# Patient Record
Sex: Male | Born: 1955 | Race: White | Hispanic: No | Smoking: Current every day smoker
Health system: Southern US, Community
[De-identification: ages and names within clinical notes are randomized; demographics above are authoritative.]

## PROBLEM LIST (undated history)

## (undated) DIAGNOSIS — I10 Essential (primary) hypertension: Secondary | ICD-10-CM

## (undated) DIAGNOSIS — I219 Acute myocardial infarction, unspecified: Secondary | ICD-10-CM

---

## 2014-06-01 ENCOUNTER — Other Ambulatory Visit (HOSPITAL_COMMUNITY): Payer: Self-pay | Admitting: Family Medicine

## 2014-06-01 ENCOUNTER — Ambulatory Visit (HOSPITAL_COMMUNITY)
Admission: RE | Admit: 2014-06-01 | Discharge: 2014-06-01 | Disposition: A | Discharge status: Home / Self Care | Payer: Disability Insurance | Source: Ambulatory Visit | Attending: Family Medicine | Admitting: Family Medicine

## 2014-06-01 DIAGNOSIS — M11261 Other chondrocalcinosis, right knee: Secondary | ICD-10-CM | POA: Diagnosis not present

## 2014-06-01 DIAGNOSIS — M25561 Pain in right knee: Secondary | ICD-10-CM | POA: Insufficient documentation

## 2014-06-01 DIAGNOSIS — M545 Low back pain, unspecified: Secondary | ICD-10-CM

## 2014-06-01 DIAGNOSIS — I739 Peripheral vascular disease, unspecified: Secondary | ICD-10-CM | POA: Insufficient documentation

## 2014-06-01 DIAGNOSIS — M25562 Pain in left knee: Secondary | ICD-10-CM | POA: Diagnosis not present

## 2014-06-01 DIAGNOSIS — I70292 Other atherosclerosis of native arteries of extremities, left leg: Secondary | ICD-10-CM | POA: Diagnosis not present

## 2014-06-01 DIAGNOSIS — M79641 Pain in right hand: Secondary | ICD-10-CM | POA: Insufficient documentation

## 2014-06-01 DIAGNOSIS — M5136 Other intervertebral disc degeneration, lumbar region: Secondary | ICD-10-CM | POA: Insufficient documentation

## 2014-06-01 DIAGNOSIS — M1288 Other specific arthropathies, not elsewhere classified, other specified site: Secondary | ICD-10-CM | POA: Diagnosis not present

## 2015-05-18 ENCOUNTER — Emergency Department: Payer: Medicaid Other

## 2015-05-18 ENCOUNTER — Inpatient Hospital Stay
Admission: EM | Admit: 2015-05-18 | Discharge: 2015-05-21 | DRG: 065 | Disposition: A | Discharge status: Rehabilitation Facility | Payer: Medicaid Other | Attending: Internal Medicine | Admitting: Internal Medicine

## 2015-05-18 ENCOUNTER — Observation Stay: Payer: Medicaid Other

## 2015-05-18 ENCOUNTER — Observation Stay (HOSPITAL_BASED_OUTPATIENT_CLINIC_OR_DEPARTMENT_OTHER)
Admit: 2015-05-18 | Discharge: 2015-05-18 | Disposition: A | Discharge status: Home / Self Care | Payer: Medicaid Other | Attending: Internal Medicine | Admitting: Internal Medicine

## 2015-05-18 DIAGNOSIS — I6350 Cerebral infarction due to unspecified occlusion or stenosis of unspecified cerebral artery: Secondary | ICD-10-CM

## 2015-05-18 DIAGNOSIS — I639 Cerebral infarction, unspecified: Secondary | ICD-10-CM | POA: Diagnosis present

## 2015-05-18 DIAGNOSIS — I63532 Cerebral infarction due to unspecified occlusion or stenosis of left posterior cerebral artery: Principal | ICD-10-CM | POA: Diagnosis present

## 2015-05-18 DIAGNOSIS — I6359 Cerebral infarction due to unspecified occlusion or stenosis of other cerebral artery: Secondary | ICD-10-CM | POA: Diagnosis present

## 2015-05-18 DIAGNOSIS — H53461 Homonymous bilateral field defects, right side: Secondary | ICD-10-CM | POA: Diagnosis present

## 2015-05-18 DIAGNOSIS — I252 Old myocardial infarction: Secondary | ICD-10-CM

## 2015-05-18 DIAGNOSIS — G8191 Hemiplegia, unspecified affecting right dominant side: Secondary | ICD-10-CM | POA: Diagnosis present

## 2015-05-18 DIAGNOSIS — Z6841 Body Mass Index (BMI) 40.0 and over, adult: Secondary | ICD-10-CM

## 2015-05-18 DIAGNOSIS — Z139 Encounter for screening, unspecified: Secondary | ICD-10-CM

## 2015-05-18 DIAGNOSIS — M810 Age-related osteoporosis without current pathological fracture: Secondary | ICD-10-CM | POA: Diagnosis present

## 2015-05-18 DIAGNOSIS — F17210 Nicotine dependence, cigarettes, uncomplicated: Secondary | ICD-10-CM | POA: Diagnosis present

## 2015-05-18 DIAGNOSIS — M1990 Unspecified osteoarthritis, unspecified site: Secondary | ICD-10-CM | POA: Diagnosis present

## 2015-05-18 DIAGNOSIS — R29810 Facial weakness: Secondary | ICD-10-CM | POA: Diagnosis present

## 2015-05-18 DIAGNOSIS — I10 Essential (primary) hypertension: Secondary | ICD-10-CM | POA: Diagnosis present

## 2015-05-18 DIAGNOSIS — Z8249 Family history of ischemic heart disease and other diseases of the circulatory system: Secondary | ICD-10-CM

## 2015-05-18 DIAGNOSIS — E785 Hyperlipidemia, unspecified: Secondary | ICD-10-CM | POA: Diagnosis present

## 2015-05-18 HISTORY — DX: Essential (primary) hypertension: I10

## 2015-05-18 HISTORY — DX: Acute myocardial infarction, unspecified: I21.9

## 2015-05-18 LAB — COMPREHENSIVE METABOLIC PANEL
ALK PHOS: 27 U/L — AB (ref 38–126)
ALT: 61 U/L (ref 17–63)
AST: 52 U/L — AB (ref 15–41)
Albumin: 4.3 g/dL (ref 3.5–5.0)
Anion gap: 8 (ref 5–15)
BUN: 14 mg/dL (ref 6–20)
CALCIUM: 9.4 mg/dL (ref 8.9–10.3)
CHLORIDE: 102 mmol/L (ref 101–111)
CO2: 25 mmol/L (ref 22–32)
CREATININE: 1.16 mg/dL (ref 0.61–1.24)
Glucose, Bld: 102 mg/dL — ABNORMAL HIGH (ref 65–99)
Potassium: 4.3 mmol/L (ref 3.5–5.1)
Sodium: 135 mmol/L (ref 135–145)
Total Bilirubin: 0.9 mg/dL (ref 0.3–1.2)
Total Protein: 7.7 g/dL (ref 6.5–8.1)

## 2015-05-18 LAB — CBC WITH DIFFERENTIAL/PLATELET
Basophils Absolute: 0.1 10*3/uL (ref 0–0.1)
Basophils Relative: 1 %
Eosinophils Absolute: 0 10*3/uL (ref 0–0.7)
Eosinophils Relative: 0 %
HEMATOCRIT: 47.4 % (ref 40.0–52.0)
Hemoglobin: 15.8 g/dL (ref 13.0–18.0)
LYMPHS ABS: 1.2 10*3/uL (ref 1.0–3.6)
LYMPHS PCT: 12 %
MCH: 31.6 pg (ref 26.0–34.0)
MCHC: 33.4 g/dL (ref 32.0–36.0)
MCV: 94.8 fL (ref 80.0–100.0)
MONO ABS: 0.8 10*3/uL (ref 0.2–1.0)
Monocytes Relative: 8 %
NEUTROS ABS: 8.2 10*3/uL — AB (ref 1.4–6.5)
Neutrophils Relative %: 79 %
Platelets: 257 10*3/uL (ref 150–440)
RBC: 5 MIL/uL (ref 4.40–5.90)
RDW: 13 % (ref 11.5–14.5)
WBC: 10.3 10*3/uL (ref 3.8–10.6)

## 2015-05-18 LAB — URINE DRUG SCREEN, QUALITATIVE (ARMC ONLY)
AMPHETAMINES, UR SCREEN: NOT DETECTED
Barbiturates, Ur Screen: NOT DETECTED
Benzodiazepine, Ur Scrn: NOT DETECTED
Cannabinoid 50 Ng, Ur ~~LOC~~: NOT DETECTED
Cocaine Metabolite,Ur ~~LOC~~: NOT DETECTED
MDMA (ECSTASY) UR SCREEN: NOT DETECTED
Methadone Scn, Ur: NOT DETECTED
Opiate, Ur Screen: NOT DETECTED
Phencyclidine (PCP) Ur S: NOT DETECTED
Tricyclic, Ur Screen: NOT DETECTED

## 2015-05-18 LAB — TROPONIN I: Troponin I: <0.03 ng/mL (ref <0.031)

## 2015-05-18 LAB — PROTIME-INR
INR: 1.07
Prothrombin Time: 14.1 seconds (ref 11.4–15.0)

## 2015-05-18 LAB — ETHANOL: ALCOHOL ETHYL (B): 5 mg/dL — AB (ref <5)

## 2015-05-18 MED ORDER — NICOTINE 21 MG/24HR TD PT24
21.0000 mg | MEDICATED_PATCH | Freq: Every day | TRANSDERMAL | Status: DC
  Administered 2015-05-19 – 2015-05-21 (×3): 21 mg via TRANSDERMAL
  Filled 2015-05-18 (×3): qty 1

## 2015-05-18 MED ORDER — ACETAMINOPHEN 325 MG PO TABS
650.0000 mg | ORAL_TABLET | Freq: Once | ORAL | Status: AC
  Administered 2015-05-18: 650 mg via ORAL

## 2015-05-18 MED ORDER — ENOXAPARIN SODIUM 40 MG/0.4ML ~~LOC~~ SOLN
40.0000 mg | SUBCUTANEOUS | Status: DC
  Administered 2015-05-19 (×2): 40 mg via SUBCUTANEOUS
  Filled 2015-05-18 (×2): qty 0.4

## 2015-05-18 MED ORDER — PNEUMOCOCCAL VAC POLYVALENT 25 MCG/0.5ML IJ INJ
0.5000 mL | INJECTION | INTRAMUSCULAR | Status: DC
  Filled 2015-05-18: qty 0.5

## 2015-05-18 MED ORDER — STROKE: EARLY STAGES OF RECOVERY BOOK
Freq: Once | Status: AC
  Administered 2015-05-18: 18:00:00

## 2015-05-18 MED ORDER — ASPIRIN EC 325 MG PO TBEC
325.0000 mg | DELAYED_RELEASE_TABLET | Freq: Every day | ORAL | Status: DC
  Administered 2015-05-18 – 2015-05-21 (×4): 325 mg via ORAL
  Filled 2015-05-18 (×5): qty 1

## 2015-05-18 MED ORDER — ACETAMINOPHEN 325 MG PO TABS
ORAL_TABLET | ORAL | Status: AC
  Filled 2015-05-18: qty 2
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
## 2015-05-18 NOTE — Progress Notes (Signed)
*  PRELIMINARY RESULTS* Echocardiogram 2D Echocardiogram has been performed.  Bryan Schultz 05/18/2015, 7:17 PM
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
## 2015-05-18 NOTE — ED Notes (Signed)
Patient continues to notice numbness R arm. Grip is equal to L however coordination of R arm and hand less. Continues headache. Speech clear, patient oriented.

## 2015-05-18 NOTE — ED Notes (Signed)
R arm numbness, grips equal but weak to patient. States he does not know when this started or if he had it this am.

## 2015-05-19 ENCOUNTER — Inpatient Hospital Stay: Payer: Medicaid Other

## 2015-05-19 ENCOUNTER — Encounter: Payer: Self-pay | Admitting: Radiology

## 2015-05-19 DIAGNOSIS — G8191 Hemiplegia, unspecified affecting right dominant side: Secondary | ICD-10-CM | POA: Diagnosis present

## 2015-05-19 DIAGNOSIS — I63532 Cerebral infarction due to unspecified occlusion or stenosis of left posterior cerebral artery: Secondary | ICD-10-CM | POA: Diagnosis not present

## 2015-05-19 DIAGNOSIS — H53461 Homonymous bilateral field defects, right side: Secondary | ICD-10-CM | POA: Diagnosis present

## 2015-05-19 DIAGNOSIS — I252 Old myocardial infarction: Secondary | ICD-10-CM | POA: Diagnosis not present

## 2015-05-19 DIAGNOSIS — Z6841 Body Mass Index (BMI) 40.0 and over, adult: Secondary | ICD-10-CM | POA: Diagnosis not present

## 2015-05-19 DIAGNOSIS — I639 Cerebral infarction, unspecified: Secondary | ICD-10-CM | POA: Diagnosis present

## 2015-05-19 DIAGNOSIS — I10 Essential (primary) hypertension: Secondary | ICD-10-CM | POA: Diagnosis present

## 2015-05-19 DIAGNOSIS — Z8249 Family history of ischemic heart disease and other diseases of the circulatory system: Secondary | ICD-10-CM | POA: Diagnosis not present

## 2015-05-19 DIAGNOSIS — I6359 Cerebral infarction due to unspecified occlusion or stenosis of other cerebral artery: Secondary | ICD-10-CM | POA: Diagnosis present

## 2015-05-19 DIAGNOSIS — F17210 Nicotine dependence, cigarettes, uncomplicated: Secondary | ICD-10-CM | POA: Diagnosis present

## 2015-05-19 DIAGNOSIS — R29810 Facial weakness: Secondary | ICD-10-CM | POA: Diagnosis present

## 2015-05-19 DIAGNOSIS — E785 Hyperlipidemia, unspecified: Secondary | ICD-10-CM | POA: Diagnosis present

## 2015-05-19 DIAGNOSIS — M810 Age-related osteoporosis without current pathological fracture: Secondary | ICD-10-CM | POA: Diagnosis present

## 2015-05-19 DIAGNOSIS — M1990 Unspecified osteoarthritis, unspecified site: Secondary | ICD-10-CM | POA: Diagnosis present

## 2015-05-19 LAB — URINALYSIS COMPLETE WITH MICROSCOPIC (ARMC ONLY)
BACTERIA UA: NONE SEEN
Bilirubin Urine: NEGATIVE
Glucose, UA: NEGATIVE mg/dL
HGB URINE DIPSTICK: NEGATIVE
KETONES UR: NEGATIVE mg/dL
LEUKOCYTES UA: NEGATIVE
NITRITE: NEGATIVE
PH: 6 (ref 5.0–8.0)
PROTEIN: NEGATIVE mg/dL
SPECIFIC GRAVITY, URINE: 1.017 (ref 1.005–1.030)

## 2015-05-19 LAB — LIPID PANEL
Cholesterol: 148 mg/dL (ref 0–200)
HDL: 33 mg/dL — AB (ref >40)
LDL CALC: 93 mg/dL (ref 0–99)
TRIGLYCERIDES: 111 mg/dL (ref <150)
Total CHOL/HDL Ratio: 4.5 RATIO
VLDL: 22 mg/dL (ref 0–40)

## 2015-05-19 MED ORDER — IOHEXOL 350 MG/ML SOLN: 80.0000 mL | Freq: Once | INTRAVENOUS | Status: DC | PRN

## 2015-05-19 MED ORDER — HYDRALAZINE HCL 20 MG/ML IJ SOLN
10.0000 mg | Freq: Four times a day (QID) | INTRAMUSCULAR | Status: DC | PRN
Start: 2015-05-19 — End: 2015-05-21

## 2015-05-19 MED ORDER — ATORVASTATIN CALCIUM 20 MG PO TABS
40.0000 mg | ORAL_TABLET | Freq: Every day | ORAL | Status: DC
  Administered 2015-05-19 – 2015-05-20 (×2): 40 mg via ORAL
  Filled 2015-05-19 (×2): qty 2

## 2015-05-19 NOTE — Progress Notes (Addendum)
   05/19/15 1915  What Happened  Was fall witnessed? No  Was patient injured? No  Patient found on floor  Found by Staff-comment  Stated prior activity bathroom-unassisted  Follow Up  MD notified dr. Sheryle Haildiamond  Time MD notified [phone]  Family notified No- patient refusal (unable to get on the phone at this time)  Time family notified 1923  Additional tests No  Vitals  Temp 98.1 F (36.7 C)  Temp Source Oral  BP (!) 150/90 mmHg  MAP (mmHg) 106  BP Method Automatic  Pulse Rate 97  Pulse Rate Source Monitor  [facility] notified
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
## 2015-05-19 NOTE — Progress Notes (Signed)
Patient found on the floor beside of bed. Patient stated that he was not fully awake and tried to get up to use the bathroom. Found by Lars MassonKalani, NT. Bo McclintockBrewer,Elasia Furnish S, RN
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
## 2015-05-19 NOTE — Progress Notes (Signed)
Speech Therapy Note: received order, reviewed chart notes and consulted NSG re: pt's status this AM. NSG reported no overt deficits; pt was swallowing pills w/ water and communicating wants/needs appropriately. Upon meeting pt in room, pt stated same. He denied any s/s of dysphagia and was verbally communicating on his cell phone w/ family appropriately. Pt is missing most dentition which can impact articulation of speech.  As pt denies any ST needs at this time, ST services will sign off but be available for any further if pt's status changes. NSG agreed. Pt agreed.
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
## 2015-05-19 NOTE — Plan of Care (Signed)
Problem: Activity: Goal: Risk for activity intolerance will decrease Outcome: Progressing Pt has been resting comfortably during shift. Pt has some weakness to right side but hand grips are strong. No other sign of distress noted. Will continue to monitor.

## 2015-05-20 ENCOUNTER — Inpatient Hospital Stay: Payer: Medicaid Other

## 2015-05-20 MED ORDER — AMLODIPINE BESYLATE 5 MG PO TABS
5.0000 mg | ORAL_TABLET | Freq: Every day | ORAL | Status: DC
  Administered 2015-05-20 – 2015-05-21 (×2): 5 mg via ORAL
  Filled 2015-05-20 (×2): qty 1

## 2015-05-20 MED ORDER — ENOXAPARIN SODIUM 40 MG/0.4ML ~~LOC~~ SOLN
40.0000 mg | Freq: Two times a day (BID) | SUBCUTANEOUS | Status: DC
  Administered 2015-05-20 – 2015-05-21 (×2): 40 mg via SUBCUTANEOUS
  Filled 2015-05-20 (×2): qty 0.4

## 2015-05-20 MED ORDER — ACETAMINOPHEN 325 MG PO TABS
650.0000 mg | ORAL_TABLET | Freq: Four times a day (QID) | ORAL | Status: DC | PRN
  Administered 2015-05-20 – 2015-05-21 (×2): 650 mg via ORAL
  Filled 2015-05-20 (×2): qty 2

## 2015-05-20 NOTE — Plan of Care (Signed)
Problem: Education: Goal: Knowledge of disease or condition will improve Outcome: Progressing Pt educated on stroke plan, stroke handout given to the pt, pt educated on falls prevention, verbalized understanding, pt given pt information guide  Problem: Health Behavior/Discharge Planning: Goal: Ability to manage health-related needs will improve Outcome: Progressing No plans for discharge at this time  Problem: Self-Care: Goal: Ability to participate in self-care as condition permits will improve Outcome: Progressing Pt calling for assistance when needed  Problem: Tissue Perfusion: Goal: Cerebral tissue perfusion will improve Outcome: Progressing VSS, afebrile, q2hr vitals and neuro checks continue  Problem: Safety: Goal: Ability to remain free from injury will improve Outcome: Progressing Pt is high falls, bed and chair alarms in use, call bell and phone within reach, hourly safety rounds made, pt remains free of injury this shift  Problem: Activity: Goal: Risk for activity intolerance will decrease Outcome: Progressing Pt needs assistance with transfers
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
## 2015-05-20 NOTE — Progress Notes (Signed)
59 y/o M on Lovenox 40 mg daily for DVT prophylaxis.   BMI= 49, CrCl 115 ml/min  Due to BMI > 40, will change Lovenox to 40 mg bid.

## 2015-05-20 NOTE — Plan of Care (Signed)
Problem: Tissue Perfusion: Goal: Cerebral tissue perfusion will improve Outcome: Progressing Pt had no injury from fall on assessment. Pt was difficult to get back in bed and and require the lift and many other staff. A later assessment revealed a bruise on left upper arm. Pt has been resting comfortable the shift. Pt had an incontinent episode x1 during shift and pt had a partial bath and linen change. No other signs of distress noted. Will continue to monitor.

## 2015-05-21 ENCOUNTER — Inpatient Hospital Stay (HOSPITAL_COMMUNITY)
Admission: RE | Admit: 2015-05-21 | Discharge: 2015-06-05 | DRG: 057 | Disposition: A | Discharge status: Home Health | Payer: Medicaid Other | Source: Intra-hospital | Attending: Physical Medicine & Rehabilitation | Admitting: Physical Medicine & Rehabilitation

## 2015-05-21 DIAGNOSIS — H53461 Homonymous bilateral field defects, right side: Secondary | ICD-10-CM | POA: Diagnosis not present

## 2015-05-21 DIAGNOSIS — I639 Cerebral infarction, unspecified: Secondary | ICD-10-CM | POA: Diagnosis not present

## 2015-05-21 DIAGNOSIS — Z8249 Family history of ischemic heart disease and other diseases of the circulatory system: Secondary | ICD-10-CM | POA: Diagnosis not present

## 2015-05-21 DIAGNOSIS — E785 Hyperlipidemia, unspecified: Secondary | ICD-10-CM | POA: Diagnosis not present

## 2015-05-21 DIAGNOSIS — I69993 Ataxia following unspecified cerebrovascular disease: Secondary | ICD-10-CM | POA: Diagnosis not present

## 2015-05-21 DIAGNOSIS — F17210 Nicotine dependence, cigarettes, uncomplicated: Secondary | ICD-10-CM

## 2015-05-21 DIAGNOSIS — I63532 Cerebral infarction due to unspecified occlusion or stenosis of left posterior cerebral artery: Secondary | ICD-10-CM | POA: Diagnosis not present

## 2015-05-21 DIAGNOSIS — Z720 Tobacco use: Secondary | ICD-10-CM | POA: Insufficient documentation

## 2015-05-21 DIAGNOSIS — J329 Chronic sinusitis, unspecified: Secondary | ICD-10-CM

## 2015-05-21 DIAGNOSIS — I10 Essential (primary) hypertension: Secondary | ICD-10-CM | POA: Insufficient documentation

## 2015-05-21 DIAGNOSIS — R600 Localized edema: Secondary | ICD-10-CM

## 2015-05-21 DIAGNOSIS — R278 Other lack of coordination: Secondary | ICD-10-CM | POA: Insufficient documentation

## 2015-05-21 DIAGNOSIS — I69398 Other sequelae of cerebral infarction: Secondary | ICD-10-CM

## 2015-05-21 DIAGNOSIS — Z6841 Body Mass Index (BMI) 40.0 and over, adult: Secondary | ICD-10-CM

## 2015-05-21 DIAGNOSIS — I69351 Hemiplegia and hemiparesis following cerebral infarction affecting right dominant side: Secondary | ICD-10-CM | POA: Diagnosis present

## 2015-05-21 DIAGNOSIS — I69319 Unspecified symptoms and signs involving cognitive functions following cerebral infarction: Secondary | ICD-10-CM

## 2015-05-21 DIAGNOSIS — J0110 Acute frontal sinusitis, unspecified: Secondary | ICD-10-CM | POA: Diagnosis not present

## 2015-05-21 LAB — CBC
HCT: 45.3 % (ref 39.0–52.0)
Hemoglobin: 15.7 g/dL (ref 13.0–17.0)
MCH: 32.4 pg (ref 26.0–34.0)
MCHC: 34.7 g/dL (ref 30.0–36.0)
MCV: 93.4 fL (ref 78.0–100.0)
PLATELETS: 232 10*3/uL (ref 150–400)
RBC: 4.85 MIL/uL (ref 4.22–5.81)
RDW: 12.7 % (ref 11.5–15.5)
WBC: 6.7 10*3/uL (ref 4.0–10.5)

## 2015-05-21 LAB — BASIC METABOLIC PANEL
ANION GAP: 8 (ref 5–15)
BUN: 15 mg/dL (ref 6–20)
CO2: 24 mmol/L (ref 22–32)
Calcium: 9.4 mg/dL (ref 8.9–10.3)
Chloride: 107 mmol/L (ref 101–111)
Creatinine, Ser: 1.07 mg/dL (ref 0.61–1.24)
GLUCOSE: 92 mg/dL (ref 65–99)
POTASSIUM: 4.2 mmol/L (ref 3.5–5.1)
Sodium: 139 mmol/L (ref 135–145)

## 2015-05-21 LAB — CREATININE, SERUM
CREATININE: 1.25 mg/dL — AB (ref 0.61–1.24)
GFR calc Af Amer: >60 mL/min (ref >60)
GFR calc non Af Amer: >60 mL/min (ref >60)

## 2015-05-21 MED ORDER — ASPIRIN EC 325 MG PO TBEC
325.0000 mg | DELAYED_RELEASE_TABLET | Freq: Every day | ORAL | Status: DC
  Administered 2015-05-22 – 2015-06-05 (×15): 325 mg via ORAL
  Filled 2015-05-21 (×15): qty 1

## 2015-05-21 MED ORDER — ONDANSETRON HCL 4 MG PO TABS: 4.0000 mg | ORAL_TABLET | Freq: Four times a day (QID) | ORAL | Status: DC | PRN

## 2015-05-21 MED ORDER — AMLODIPINE BESYLATE 5 MG PO TABS: 5.0000 mg | ORAL_TABLET | Freq: Every day | ORAL | Status: DC

## 2015-05-21 MED ORDER — ENOXAPARIN SODIUM 40 MG/0.4ML ~~LOC~~ SOLN: 40.0000 mg | Freq: Two times a day (BID) | SUBCUTANEOUS | Status: DC

## 2015-05-21 MED ORDER — ATORVASTATIN CALCIUM 40 MG PO TABS: 40.0000 mg | ORAL_TABLET | Freq: Every day | ORAL | Status: DC

## 2015-05-21 MED ORDER — ONDANSETRON HCL 4 MG/2ML IJ SOLN: 4.0000 mg | Freq: Four times a day (QID) | INTRAMUSCULAR | Status: DC | PRN

## 2015-05-21 MED ORDER — ACETAMINOPHEN 325 MG PO TABS: 650.0000 mg | ORAL_TABLET | Freq: Four times a day (QID) | ORAL | Status: AC | PRN

## 2015-05-21 MED ORDER — ASPIRIN 325 MG PO TBEC: 325.0000 mg | DELAYED_RELEASE_TABLET | Freq: Every day | ORAL | Status: DC

## 2015-05-21 MED ORDER — AMLODIPINE BESYLATE 5 MG PO TABS
5.0000 mg | ORAL_TABLET | Freq: Every day | ORAL | Status: DC
  Administered 2015-05-22 – 2015-06-05 (×15): 5 mg via ORAL
  Filled 2015-05-21 (×15): qty 1

## 2015-05-21 MED ORDER — NICOTINE 21 MG/24HR TD PT24
21.0000 mg | MEDICATED_PATCH | Freq: Every day | TRANSDERMAL | Status: DC
  Administered 2015-05-22 – 2015-06-05 (×15): 21 mg via TRANSDERMAL
  Filled 2015-05-21 (×15): qty 1

## 2015-05-21 MED ORDER — NICOTINE 21 MG/24HR TD PT24: 21.0000 mg | MEDICATED_PATCH | Freq: Every day | TRANSDERMAL | Status: DC

## 2015-05-21 MED ORDER — SORBITOL 70 % SOLN: 30.0000 mL | Freq: Every day | Status: DC | PRN

## 2015-05-21 MED ORDER — ENOXAPARIN SODIUM 40 MG/0.4ML ~~LOC~~ SOLN
40.0000 mg | Freq: Two times a day (BID) | SUBCUTANEOUS | Status: DC
  Administered 2015-05-21 – 2015-06-05 (×30): 40 mg via SUBCUTANEOUS
  Filled 2015-05-21 (×30): qty 0.4

## 2015-05-21 MED ORDER — ACETAMINOPHEN 325 MG PO TABS
650.0000 mg | ORAL_TABLET | Freq: Four times a day (QID) | ORAL | Status: DC | PRN
  Administered 2015-05-22 – 2015-06-05 (×26): 650 mg via ORAL
  Filled 2015-05-21 (×29): qty 2

## 2015-05-21 MED ORDER — ATORVASTATIN CALCIUM 40 MG PO TABS
40.0000 mg | ORAL_TABLET | Freq: Every day | ORAL | Status: DC
  Administered 2015-05-21 – 2015-06-04 (×15): 40 mg via ORAL
  Filled 2015-05-21 (×14): qty 1

## 2015-05-21 NOTE — Interval H&P Note (Signed)
Bryan Schultz was admitted today to Inpatient Rehabilitation with the diagnosis of left PCA infarct.  The patient's history has been reviewed, patient examined, and there is no change in status.  Patient continues to be appropriate for intensive inpatient rehabilitation.  I have reviewed the patient's chart and labs.  Questions were answered to the patient's satisfaction. The PAPE has been reviewed and assessment remains appropriate.  Bryan Schultz Bryan Schultz 05/21/2015, 10:31 PM

## 2015-05-21 NOTE — Care Management (Signed)
Admitted to [facility] with the diagnosis of acute CVA. Lives alone. Friend of 18 years is April Deldgo [phone]). Last seen Dr. Moishe Spiceuhl a week ago [facility]. No home health. No skilled facility. No home oxygen. Uses no aids for ambulation prior to this admission. Takes care of all basic and instrumental activities of daily living himself prior to this admission.  Physical therapy evaluation completed. Recommending acute inpatient rehabilitation. Spoke with [name] and friend at the bedside. Both are in agreement for transferring to Acute Inpatient [facility] at [facility].  Lelon FrohlichEugenia Logue, RN representative for Inpatient Acute Rehabilitation at [facility] updated. Gwenette GreetBrenda S Brandilyn Nanninga RN MSN CCM Care Management [phone]

## 2015-05-21 NOTE — PMR Pre-admission (Addendum)
[facility] PMR Admission Coordinator Pre-Admission Assessment  Patient: Bryan Schultz is an 59 y.o., male MRN: [id number] DOB: Aug 01, 1955 Height:  (185.4 cm) Weight: (!) 170.694 kg (376 lb 5 oz)  Insurance Information HMO:      PPO:       PCP:       IPA:       80/20:       OTHER:   PRIMARY: Medicaid [place] access      Policy#: [id number]      Subscriber: Wille Celeste CM Name:        Phone#:       Fax#:   Pre-Cert#:        Employer: Not employed/Disabled Benefits:  Phone #: [phone]     Name: Automated Eff. Date: Eligible 05/21/15 with coverage code [id number]     Deduct:        Out of Pocket Max:        Life Max:   CIR:        SNF:   Outpatient:       Co-Pay:   Home Health:        Co-Pay:   DME:       Co-Pay:   Providers:     Emergency Contact Information Contact Information    Name Relation Home Work Mobile   Delgoto,April Significant other   [phone]   Kaiel, [place]   [phone]      Current Medical History  Patient Admitting Diagnosis:  L PCA infarct  History of Present Illness: A 59 year old right-handed male with history essential hypertension, tobacco abuse, morbid obesity 376 pounds. On no prescription medications prior to admission. Patient lives alone independently prior to admission using a walking stick with report of recent falls. Plans to stay with his girlfriend. One level apartment second floor with elevator. Presented 05/18/2015 with altered mental status and right-sided weakness. CT showed hypoattenuation in left occipital lobe consistent with PCA territory infarct. CT cervical spine unremarkable except some spurring at C5-6-6-7. Patient did not receive TPA. Echocardiogram with ejection fraction of 60% grade 1 diastolic dysfunction. No cardiac source of emboli. CT angiogram of head and neck with distal small vessel attenuation corresponding the distal microvascular changes. 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 Neurology consulted  placed on aspirin for CVA prophylaxis. Subcutaneous Lovenox for DVT prophylaxis. Tolerating a regular diet. Follow-up CT of the head 05/20/2015 shows evolving left temporal, parietal and occipital lobe infarcts no evidence of hemorrhagic transformation. Therapy evaluations completed requiring min mod assist for bed mobility and transfers. Patient to be admitted for comprehensive inpatient rehabilitation program.   Patient's medical record from [facility] has been reviewed by the rehabilitation admission coordinator and physician.  NIH Stroke scale: 4  Past Medical History  Past Medical History  Diagnosis Date  . Hypertension   . Myocardial infarction [facility])     Family History   family history includes Hypertension in his brother, father, and mother.  Prior Rehab/Hospitalizations Has the patient had major surgery during 100 days prior to admission? No    Current Medications See MAR from [facility]  Patients Current Diet:   Heart diet with thin liquids  Precautions / Restrictions Precautions Precautions: Fall Restrictions Weight Bearing Restrictions: No   Has the patient had 2 or more falls or a fall with injury in the past year?Yes.  Reports at least 6 falls in the past 6 months with minor injuries.  Prior Activity Level Community (5-7x/wk): Went out  daily.  Is very social.  Prior Functional Level Self Care: Did the patient need help bathing, dressing, using the toilet or eating?  Independent  Indoor Mobility: Did the patient need assistance with walking from room to room (with or without device)? Independent  Stairs: Did the patient need assistance with internal or external stairs (with or without device)? Independent  Functional Cognition: Did the patient need help planning regular tasks such as shopping or remembering to take medications? Independent  Home Assistive Devices / Equipment Home Assistive Devices/Equipment: Cane (specify quad or straight)  (single) Home Equipment: Walker - 2 wheels, Cane - single point  Prior Device Use: Indicate devices/aids used by the patient prior to current illness, exacerbation or injury? Walker   Prior Functional Level Current Functional Level  Bed Mobility  Independent  Mod assist   Transfers  Independent  Mod assist   Mobility - Walk/Wheelchair  Independent  Other (Unable)   Upper Body Dressing  Independent  Mod assist   Lower Body Dressing  Independent  Max assist   Grooming  Independent  Min assist   Eating/Drinking  Independent  Min assist   Toilet Transfer  Independent  Total assist   Bladder Continence   Incontinence at times  Some incontinence   Bowel Management  Incontinence at times  Some incontinence.  Last BM 05/20/15   Stair Climbing  Independent  Other (Unable)   Communication  WDL  WDL   Memory  WDL  Intact   Cooking/Meal Prep  Independent      Housework  Independent    Money Management  Independent    Driving  Yes, independent     Special needs/care consideration BiPAP/CPAP No, but likely has sleep apnea per his girlfriend CPM No Continuous Drip IV No Dialysis No         Life Vest No Oxygen No Special Bed No Trach Size No Wound Vac (area) No      Skin: Has abrasions from accident                              Bowel mgmt: Incontinent at times.  Last BM 05/20/15 Bladder mgmt: Voiding in urinal with incontinence at times Diabetic mgmt: Prediabetic per girlfriend  Previous Home Environment Living Arrangements: Alone Available Help at Discharge: Friend(s), Available PRN/intermittently Type of Home: House Home Layout: One level Home Access: Stairs to enter Entrance Stairs-Rails: None Entrance Stairs-Number of Steps: 5 Bathroom Shower/Tub: Hydrographic surveyor, Sport and exercise psychologist: Pharmacist, community: Yes Home Care Services: No Additional Comments: Patient reports he was on disability but would "piddle around" doing  odd jobs to help make money.    Discharge Living Setting Plans for Discharge Living Setting: Lives with (comment), Apartment (Plans home with Girlfriend after rehab.) Type of Home at Discharge: Apartment (2nd floor apartment.  Building has an Engineer, structural.) Discharge Home Layout: One level Discharge Home Access: Other (comment) (Small step up from curb.) Does the patient have any problems obtaining your medications?: No  Social/Family/Support Systems Patient Roles: Other (Comment) (Has a girlfriend.) Contact Information: April Delgoto - girlfriend Anticipated Caregiver: April Anticipated Caregiver's Contact Information: April - GF [phone] Ability/Limitations of Caregiver: April does not work.  She is disabled but can do supervision and very light assistance for patient. Caregiver Availability: 24/7 Discharge Plan Discussed with Primary Caregiver: Yes Is Caregiver In Agreement with Plan?: Yes Does Caregiver/Family have Issues with Lodging/Transportation while Pt is in Rehab?: No  Goals/Additional Needs Patient/Family Goal for Rehab: PT/OT supervision to min assist goals Expected length of stay: 14-18 days Cultural Considerations: None Dietary Needs: Heart diet, thin liquids Equipment Needs: TBD Pt/Family Agrees to Admission and willing to participate: Yes Program Orientation Provided & Reviewed with Pt/Caregiver Including Roles  & Responsibilities: Yes  Patient Condition: I have reviewed all notes from [facility] and I have shared all information with rehab MD.  Patient suffered a L PCA infarct.  He is currently receiving PT and OT and would benefit from a comprehensive inpatient program to coordinate his rehab recovery.  Patient is agreeable and is tolerating PT and OT therapies.  ST has signed off on acute at this time.  Patient was independent PTA and he can tolerate 3 hours of therapy a day.  Rehab MD has approved inpatient rehab admission for today.  Preadmission Screen  Completed By:  Trish MageLogue, Eugenia M, 05/21/2015 12:14 PM ______________________________________________________________________   Discussed status with Dr. Allena KatzPatel on 05/21/15 at 1214 and received telephone approval for admission today.  Admission Coordinator:  Trish MageLogue, Eugenia M, time 1215/Date 05/21/15   Assessment/Plan: Diagnosis: L PCA infarct  1. Does the need for close, 24 hr/day  Medical supervision in concert with the patient's rehab needs make it unreasonable for this patient to be served in a less intensive setting? Yes  2. Co-Morbidities requiring supervision/potential complications: HTN (monitor and provide prns in accordance with increased physical exertion and pain), tobacco abuse (cont to counsel), morbid obesity (Body mass index is 49.66 kg/(m^2)., diet and exercise education, cont to encourage weight loss to increase endurance and promote overall health) 3. Due to safety, disease management, medication administration and patient education, does the patient require 24 hr/day rehab nursing? Yes 4. Does the patient require coordinated care of a physician, rehab nurse, PT (1-2 hrs/day, 5 days/week) and OT (1-2 hrs/day, 5 days/week) to address physical and functional deficits in the context of the above medical diagnosis(es)? Yes Addressing deficits in the following areas: balance, endurance, locomotion, strength, transferring, bathing, dressing, toileting and psychosocial support 5. Can the patient actively participate in an intensive therapy program of at least 3 hrs of therapy 5 days a week? Yes 6. The potential for patient to make measurable gains while on inpatient rehab is excellent 7. Anticipated functional outcomes upon discharge from inpatients are: supervision and min assist PT, supervision and min assist OT, n/a SLP 8. Estimated rehab length of stay to reach the above functional goals is: 19-23 days. 9. Does the patient have adequate social supports to accommodate these discharge  functional goals? Potentially 10. Anticipated D/C setting: Home 11. Anticipated post D/C treatments: HH therapy and Home excercise program 12. Overall Rehab/Functional Prognosis: good    RECOMMENDATIONS: This patient's condition is appropriate for continued rehabilitative care in the following setting: CIR Patient has agreed to participate in recommended program. Yes Note that insurance prior authorization may be required for reimbursement for recommended care.  Trish MageLogue, Eugenia M 05/21/2015  Maryla MorrowAnkit Patel, MD

## 2015-05-21 NOTE — Clinical Social Work Note (Signed)
[facility] RNCM is following to facilitate discharge today. CSW is signing off as no further needs identified.   Dede QuerySarah Raneisha Bress, MSW, LCSW Clinical Social Worker  [phone]

## 2015-05-21 NOTE — Plan of Care (Signed)
Problem: Education: Goal: Knowledge of [facility] Education information/materials will improve Outcome: Progressing Pt is alert to oriented, delayed responses. At times confused. No c/o pain at this time.  NIH scale increased from 3 in the day time to five. Patient has some blurry vision that is new, decreased sensation on the right side, with increased confusion.Pt also exhibited some mild aphasia.  MD notified, second CT ordered with no significants results.  Problem: Safety: Goal: Ability to remain free from injury will improve Outcome: Progressing Pt is a high fall risk, encouraged to call for assistance. Impulsive at times. Family at bedside. Pt has right sided weakness, 2+ assist.

## 2015-05-21 NOTE — Clinical Social Work Note (Signed)
Clinical Social Worker received consult for New SNF placement. PT has recommended CIR. [facility]. CSW will continue to follow.   Dede QuerySarah Clarke Amburn, MSW, LCSW Clinical Social Worker  [phone]
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
## 2015-05-21 NOTE — Care Management (Signed)
[facility] back [facility] They will be accepting [name] today. Telephone call to [facility] to schedule pick-up time for 2:00pm. [name] is in agreement with this plan Gwenette GreetBrenda S Tanea Moga RN MSN CCM Care Management [phone]
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
## 2015-05-21 NOTE — Progress Notes (Signed)
Pt transferred to Rehab from [facility]. Alert and orientated x 3. Mild memory deficit noted. Poor safety awareness. Hx of fall prior to transfer. Bed alarm and SR x 3 placed. Orientated to rehab expectations, and therapy schedule.

## 2015-05-21 NOTE — Progress Notes (Signed)
Ankit Karis Juba, MD Physician Addendum Physical Medicine and Rehabilitation PMR Pre-admission 05/21/2015 12:01 PM  Related encounter: ED to Hosp-Admission (Discharged) from 05/18/2015 in [facility] (1C)    Expand All Collapse All     Secondary Market PMR Admission Coordinator Pre-Admission Assessment  Patient: Bryan Schultz is an 59 y.o., male MRN: [id number] DOB: 1956/03/12 Height:  (185.4 cm) Weight: (!) 170.694 kg (376 lb 5 oz)  Insurance Information HMO: PPO: PCP: IPA: 80/20: OTHER:  PRIMARY: Medicaid Independence access Policy#: [id number] S Subscriber: Wille Celeste CM Name: Phone#: Fax#:  Pre-Cert#: Employer: Not employed/Disabled Benefits: Phone #: [phone] Name: Automated Eff. Date: Eligible 05/21/15 with coverage code [id number] Deduct: Out of Pocket Max: Life Max:  CIR: SNF:  Outpatient: Co-Pay:  Home Health: Co-Pay:  DME: Co-Pay:  Providers:   Emergency Contact Information Contact Information    Name Relation Home Work Mobile   Delgoto,April Significant other   [phone]   Kamarri, [place]vorn   [phone]      Current Medical History  Patient Admitting Diagnosis: L PCA infarct  History of Present Illness: A 59 year old right-handed male with history essential hypertension, tobacco abuse, morbid obesity 376 pounds. On no prescription medications prior to admission. Patient lives alone independently prior to admission using a walking stick with report of recent falls. Plans to stay with his girlfriend. One level apartment second floor with elevator. Presented 05/18/2015 with altered mental status and right-sided weakness. CT showed hypoattenuation in left occipital lobe consistent with PCA territory infarct. CT cervical spine unremarkable except some spurring at C5-6-6-7. Patient did not receive  TPA. Echocardiogram with ejection fraction of 60% grade 1 diastolic dysfunction. No cardiac source of emboli. CT angiogram of head and neck with distal small vessel attenuation corresponding the distal microvascular changes. 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 Neurology consulted placed on aspirin for CVA prophylaxis. Subcutaneous Lovenox for DVT prophylaxis. Tolerating a regular diet. Follow-up CT of the head 05/20/2015 shows evolving left temporal, parietal and occipital lobe infarcts no evidence of hemorrhagic transformation. Therapy evaluations completed requiring min mod assist for bed mobility and transfers. Patient to be admitted for comprehensive inpatient rehabilitation program.  Patient's medical record from [facility] has been reviewed by the rehabilitation admission coordinator and physician.  NIH Stroke scale: 4  Past Medical History  Past Medical History  Diagnosis Date  . Hypertension   . Myocardial infarction [facility])     Family History  family history includes Hypertension in his brother, father, and mother.  Prior Rehab/Hospitalizations Has the patient had major surgery during 100 days prior to admission? No   Current Medications See MAR from [facility]  Patients Current Diet: Heart diet with thin liquids  Precautions / Restrictions Precautions Precautions: Fall Restrictions Weight Bearing Restrictions: No   Has the patient had 2 or more falls or a fall with injury in the past year?Yes. Reports at least 6 falls in the past 6 months with minor injuries.  Prior Activity Level Community (5-7x/wk): Went out daily. Is very social.  Prior Functional Level Self Care: Did the patient need help bathing, dressing, using the toilet or eating? Independent  Indoor Mobility: Did the patient need assistance with walking from room to room (with or without device)? Independent  Stairs: Did the patient need assistance with  internal or external stairs (with or without device)? Independent  Functional Cognition: Did the patient need help planning regular tasks such as shopping or remembering to take medications? Independent  Home Assistive  Devices / Equipment Home Assistive Devices/Equipment: Cane (specify quad or straight) (single) Home Equipment: Walker - 2 wheels, Cane - single point  Prior Device Use: Indicate devices/aids used by the patient prior to current illness, exacerbation or injury? Walker   Prior Functional Level Current Functional Level  Bed Mobility  Independent  Mod assist   Transfers  Independent  Mod assist   Mobility - Walk/Wheelchair  Independent  Other (Unable)   Upper Body Dressing  Independent  Mod assist   Lower Body Dressing  Independent  Max assist   Grooming  Independent  Min assist   Eating/Drinking  Independent  Min assist   Toilet Transfer  Independent  Total assist   Bladder Continence   Incontinence at times  Some incontinence   Bowel Management  Incontinence at times  Some incontinence. Last BM 05/20/15   Stair Climbing  Independent  Other (Unable)   Communication  WDL  WDL   Memory  WDL  Intact   Cooking/Meal Prep  Independent     Housework  Independent    Money Management  Independent    Driving  Yes, independent     Special needs/care consideration BiPAP/CPAP No, but likely has sleep apnea per his girlfriend CPM No Continuous Drip IV No Dialysis No  Life Vest No Oxygen No Special Bed No Trach Size No Wound Vac (area) No  Skin: Has abrasions from accident  Bowel mgmt: Incontinent at times. Last BM 05/20/15 Bladder mgmt: Voiding in urinal with incontinence at times Diabetic mgmt: Prediabetic per girlfriend  Previous Home Environment Living Arrangements: Alone Available Help at Discharge: Friend(s), Available  PRN/intermittently Type of Home: House Home Layout: One level Home Access: Stairs to enter Entrance Stairs-Rails: None Entrance Stairs-Number of Steps: 5 Bathroom Shower/Tub: Hydrographic surveyorTub/shower unit, Sport and exercise psychologistDoor Bathroom Toilet: Pharmacist, communitytandard Bathroom Accessibility: Yes Home Care Services: No Additional Comments: Patient reports he was on disability but 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   Discharge Living Setting Plans for Discharge Living Setting: Lives with (comment), Apartment (Plans home with Girlfriend after rehab.) Type of Home at Discharge: Apartment (2nd floor apartment. Building has an Engineer, structuralelevator.) Discharge Home Layout: One level Discharge Home Access: Other (comment) (Small step up from curb.) Does the patient have any problems obtaining your medications?: No  Social/Family/Support Systems Patient Roles: Other (Comment) (Has a girlfriend.) Contact Information: April Delgoto - girlfriend Anticipated Caregiver: April Anticipated Caregiver's Contact Information: April - GF [phone] Ability/Limitations of Caregiver: April does not work. She is disabled but can do supervision and very light assistance for patient. Caregiver Availability: 24/7 Discharge Plan Discussed with Primary Caregiver: Yes Is Caregiver In Agreement with Plan?: Yes Does Caregiver/Family have Issues with Lodging/Transportation while Pt is in Rehab?: No  Goals/Additional Needs Patient/Family Goal for Rehab: PT/OT supervision to min assist goals Expected length of stay: 14-18 days Cultural Considerations: None Dietary Needs: Heart diet, thin liquids Equipment Needs: TBD Pt/Family Agrees to Admission and willing to participate: Yes Program Orientation Provided & Reviewed with Pt/Caregiver Including Roles & Responsibilities: Yes  Patient Condition: I have reviewed all notes from [facility] and I have shared all information with rehab MD. Patient suffered a L PCA infarct. He is currently  receiving PT and OT and would benefit from a comprehensive inpatient program to coordinate his rehab recovery. Patient is agreeable and is tolerating PT and OT therapies. ST has signed off on acute at this time. Patient was independent PTA and he can tolerate 3 hours of therapy a  day. Rehab MD has approved inpatient rehab admission for today.  Preadmission Screen Completed By: Trish Mage, 05/21/2015 12:14 PM ______________________________________________________________________  Discussed status with Dr. Allena Katz on 05/21/15 at 1214 and received telephone approval for admission today.  Admission Coordinator: Trish Mage, time 1215/Date 05/21/15   Assessment/Plan: Diagnosis: L PCA infarct  1. Does the need for close, 24 hr/day Medical supervision in concert with the patient's rehab needs make it unreasonable for this patient to be served in a less intensive setting? Yes  2. Co-Morbidities requiring supervision/potential complications: HTN (monitor and provide prns in accordance with increased physical exertion and pain), tobacco abuse (cont to counsel), morbid obesity (Body mass index is 49.66 kg/(m^2)., diet and exercise education, cont to encourage weight loss to increase endurance and promote overall health) 3. Due to safety, disease management, medication administration and patient education, does the patient require 24 hr/day rehab nursing? Yes 4. Does the patient require coordinated care of a physician, rehab nurse, PT (1-2 hrs/day, 5 days/week) and OT (1-2 hrs/day, 5 days/week) to address physical and functional deficits in the context of the above medical diagnosis(es)? Yes Addressing deficits in the following areas: balance, endurance, locomotion, strength, transferring, bathing, dressing, toileting and psychosocial support 5. Can the patient actively participate in an intensive therapy program of at least 3 hrs of therapy 5 days a week? Yes 6. The potential for patient to  make measurable gains while on inpatient rehab is excellent 7. Anticipated functional outcomes upon discharge from inpatients are: supervision and min assist PT, supervision and min assist OT, n/a SLP 8. Estimated rehab length of stay to reach the above functional goals is: 19-23 days. 9. Does the patient have adequate social supports to accommodate these discharge functional goals? Potentially 10. Anticipated D/C setting: Home 11. Anticipated post D/C treatments: HH therapy and Home excercise program 12. Overall Rehab/Functional Prognosis: good    RECOMMENDATIONS: This patient's condition is appropriate for continued rehabilitative care in the following setting: CIR Patient has agreed to participate in recommended program. Yes Note that insurance prior authorization may be required for reimbursement for recommended care.  Trish Mage 05/21/2015  Maryla Morrow, MD       Revision History     Date/Time User Provider Type Action   05/21/2015 12:40 PM Ankit Karis Juba, MD Physician Addend   05/21/2015 12:36 PM Ankit Karis Juba, MD Physician Addend   05/21/2015 12:34 PM Ankit Karis Juba, MD Physician Addend   05/21/2015 12:30 PM Ankit Karis Juba, MD Physician Sign   05/21/2015 12:15 PM Trish Mage, RN Rehab Admission Coordinator Share   View Details Report

## 2015-05-21 NOTE — Progress Notes (Signed)
Rehab Admissions Coordinator Note:  Patient was screened by Trish MageLogue, Lagena Strand M for appropriateness for an Inpatient Acute Rehab Consult.  Noted PT and OT recommending inpatient rehab consult. At this time, we are recommending Inpatient Rehab consult.  I will follow up with case manager for potential inpatient rehab stay.  Trish MageLogue, Madilynn Montante M 05/21/2015, 8:24 AM  I can be reached at [phone].
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
## 2015-05-22 ENCOUNTER — Inpatient Hospital Stay (HOSPITAL_COMMUNITY): Payer: Medicaid Other

## 2015-05-22 ENCOUNTER — Inpatient Hospital Stay (HOSPITAL_COMMUNITY): Payer: Medicaid Other | Admitting: Physical Therapy

## 2015-05-22 LAB — CBC WITH DIFFERENTIAL/PLATELET
BASOS PCT: 1 %
Basophils Absolute: 0 10*3/uL (ref 0.0–0.1)
EOS ABS: 0.2 10*3/uL (ref 0.0–0.7)
EOS PCT: 3 %
HCT: 45.8 % (ref 39.0–52.0)
HEMOGLOBIN: 14.9 g/dL (ref 13.0–17.0)
Lymphocytes Relative: 30 %
Lymphs Abs: 2 10*3/uL (ref 0.7–4.0)
MCH: 31 pg (ref 26.0–34.0)
MCHC: 32.5 g/dL (ref 30.0–36.0)
MCV: 95.4 fL (ref 78.0–100.0)
MONOS PCT: 12 %
Monocytes Absolute: 0.8 10*3/uL (ref 0.1–1.0)
NEUTROS PCT: 54 %
Neutro Abs: 3.7 10*3/uL (ref 1.7–7.7)
PLATELETS: 238 10*3/uL (ref 150–400)
RBC: 4.8 MIL/uL (ref 4.22–5.81)
RDW: 12.7 % (ref 11.5–15.5)
WBC: 6.7 10*3/uL (ref 4.0–10.5)

## 2015-05-22 LAB — COMPREHENSIVE METABOLIC PANEL
ALBUMIN: 3.6 g/dL (ref 3.5–5.0)
ALT: 49 U/L (ref 17–63)
ANION GAP: 9 (ref 5–15)
AST: 36 U/L (ref 15–41)
Alkaline Phosphatase: 24 U/L — ABNORMAL LOW (ref 38–126)
BILIRUBIN TOTAL: 1.1 mg/dL (ref 0.3–1.2)
BUN: 10 mg/dL (ref 6–20)
CALCIUM: 9.5 mg/dL (ref 8.9–10.3)
CO2: 25 mmol/L (ref 22–32)
Chloride: 104 mmol/L (ref 101–111)
Creatinine, Ser: 1.19 mg/dL (ref 0.61–1.24)
GFR calc Af Amer: >60 mL/min (ref >60)
GLUCOSE: 98 mg/dL (ref 65–99)
POTASSIUM: 4.4 mmol/L (ref 3.5–5.1)
SODIUM: 138 mmol/L (ref 135–145)
TOTAL PROTEIN: 7.1 g/dL (ref 6.5–8.1)

## 2015-05-22 NOTE — Progress Notes (Signed)
Social Work  Social Work Assessment and Plan  Patient Details  Name: Bryan Schultz MRN: [id number] Date of Birth: 07-16-1955  Today's Date: 05/22/2015  Problem List:  Patient Active Problem List   Diagnosis Date Noted  . 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 Essential hypertension   . Morbid obesity (HCC)   . HLD (hyperlipidemia)   . Tobacco abuse   . Dysmetria   . Ataxia, late effect of cerebrovascular disease   . CVA (cerebral vascular accident) (HCC) 05/19/2015  . Acute CVA (cerebrovascular accident) (HCC) 05/18/2015   Past Medical History:  Past Medical History  Diagnosis Date  . Hypertension   . Myocardial infarction [facility])    Past Surgical History: No past surgical history on file. Social History:  reports that he has been smoking Cigarettes.  He has a 40 pack-year smoking history. He does not have any smokeless tobacco history on file. He reports that he does not drink alcohol or use illicit drugs.  Family / Support Systems Marital Status: Divorced Patient Roles: Parent, Other (Comment) (friend) Children: 30 yo daughter in [place] Other Supports: April Delgoto-friend  [phone]   Gable Prichard-brother [phone] Anticipated Caregiver: April Ability/Limitations of Caregiver: April is disabled, but can provide supervision level and she has good neighbors who will help them. Caregiver Availability: 24/7 Family Dynamics: Pt and April have been friend for 18 years they were involved for 9 years but then decided they were better friends. They will do for one another and always have. Pt's brother is supportive also. His ex-wife and daughter visit but can not assist him.  Social History Preferred language: English Religion:  Cultural Background: No issues Education: High School Read: Yes Write: Yes Employment Status: Disabled Fish farm manager  Issues: No issues Guardian/Conservator: None-according to MD pt is capable of making his own decisions while here. He is close contact with April and talks with daily   Abuse/Neglect Physical Abuse: Denies Verbal Abuse: Denies Sexual Abuse: Denies Exploitation of patient/patient's resources: Denies Self-Neglect: Denies  Emotional Status Pt's affect, behavior adn adjustment status: Pt is motivated and realizes he has deficits he needs to work on. He has always been independent and taken care of himself.  He wants regain his independence and not burden anyone. He is a big man and needs to physically be able to take care of himselfto go to April's home. Recent Psychosocial Issues: Other health issues-hx MI and osteoarthritis which limit him Pyschiatric History: No history deferred depression screen due to pt coping appropriately at this time. He would benefit from Neuro-psych will make referral to see him next week. Substance Abuse History: Tobacco realizes he needs to quit and knows the resources for this  Patient / Family Perceptions, Expectations & Goals Pt/Family understanding of illness & functional limitations: Pt and April have a basic understanding of his stroke and deficits. He is hopeful he will do well. He does talk with the MD and feels he has a good understanding but is not always aware of his right side neglect and inattention. Staff to work wiht him on this while here. Premorbid pt/family roles/activities: Father, Brother, friend, retiree, etc Anticipated changes in roles/activities/participation: resume Pt/family expectations/goals: Pt states: " I need to take care of myself before I leave here, I'm a big man."  April states: " I will do what I can and I have good neighbors too."  [facility]  Community Agencies: Other (Comment) [facility] Premorbid Home Care/DME Agencies: None Transportation available at discharge: Friend and brother,  pt drove prior to admission Resource referrals recommended: Neuropsychology, Support group (specify)  Discharge Planning Living Arrangements: Alone Support Systems: Children, Other relatives, Friends/neighbors Type of Residence: Private residence Insurance Resources: [employer] (specify county) [facility] Financial Resources: [employer] Financial Screen Referred: Previously completed Living Expenses: Psychologist, sport and exerciseent Money Management: Patient Does the patient have any problems obtaining your medications?: No Home Management: Patient Patient/Family Preliminary Plans: To go to April's apartment which is a first floor. She is able to provide supervision level due to her own disability but has supportive neighbors that may assist also. Pt and April still care about one another and have always been there for each other. Will await team's evaluations and come up with a safe discharge plan. Social Work Anticipated Follow Up Needs: HH/OP, Support Group  Clinical Impression Pleasant gentleman who is willing to work hard and recover from his stroke. He realizes he needs to change some of his lifestyle habits-including smoking and eat better. His friend-April is supportive and willing to assist him but is limited by her own disability. He will need to get to supervision level due to her health issues and his size he is a big man. Will work on discharge plans and have neuro-psych see while here since would benefit him. Only concern is his right side neglect and inattention which will make him high risk to fall if doesn't improve before discharge.  Lucy Chrisupree, Keijuan Schellhase G 05/22/2015, 12:18 PM
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
## 2015-05-22 NOTE — Evaluation (Addendum)
Occupational Therapy Assessment and Plan  Patient Details  Name: Bryan Schultz MRN: [id number] Date of Birth: 1955/06/04  OT Diagnosis: ataxia, hemiplegia affecting dominant side and muscle weakness (generalized) Rehab Potential: Rehab Potential (ACUTE ONLY): Good ELOS: 14-17 days   Today's Date: 05/22/2015 OT Individual Time: 0930-1100 OT Individual Time Calculation (min): 90 min     Problem List:  Patient Active Problem List   Diagnosis Date Noted  . 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 Essential hypertension   . Morbid obesity ([place])   . HLD (hyperlipidemia)   . Tobacco abuse   . Dysmetria   . Ataxia, late effect of cerebrovascular disease   . CVA (cerebral vascular accident) ([place]) 05/19/2015  . Acute CVA (cerebrovascular accident) ([place]) 05/18/2015    Past Medical History:  Past Medical History  Diagnosis Date  . Hypertension   . Myocardial infarction [facility])    Past Surgical History: No past surgical history on file.  Assessment & Plan Clinical Impression: Patient is a 59 y.o. right-handed male with history essential hypertension, tobacco abuse, morbid obesity 376 pounds. On no prescription medications prior to admission. Patient lives alone independently prior to admission using a walking stick with report of recent falls. Plans to stay with his girlfriend. One level apartment second floor with elevator. Presented 05/18/2015 with altered mental status and right-sided weakness. CT showed hypoattenuation in left occipital lobe consistent with PCA territory infarct. CT cervical spine unremarkable except some spurring at C5-6-6-7. Patient did not receive TPA. Echocardiogram with ejection fraction of 62% grade 1 diastolic dysfunction. No cardiac source of emboli. CT angiogram of head and neck with distal small vessel attenuation corresponding the distal microvascular  changes. Occlusion of proximal left.    Patient transferred to CIR on 05/21/2015 .    Patient currently requires moderate assistance with basic self-care skills secondary to unbalanced muscle activation and decreased coordination.  Prior to hospitalization, patient could complete BADL/iADL independently.   Patient will benefit from skilled intervention to increase independence with basic self-care skills and increase level of independence with iADL prior to discharge home with care partner.  Anticipate patient will require intermittent supervision and follow up home health.  OT - End of Session Activity Tolerance: Tolerates 30+ min activity with multiple rests Endurance Deficit: Yes OT Assessment Rehab Potential (ACUTE ONLY): Good OT Patient demonstrates impairments in the following area(s): Balance;Endurance;Motor;Sensory;Safety OT Basic ADL's Functional Problem(s): Grooming;Bathing;Dressing;Toileting OT Advanced ADL's Functional Problem(s): Simple Meal Preparation;Light Housekeeping;Laundry OT Transfers Functional Problem(s): Toilet;Tub/Shower OT Additional Impairment(s): Fuctional Use of Upper Extremity (Gross and FMC of RUE) OT Plan OT Intensity: Minimum of 1-2 x/day, 45 to 90 minutes OT Frequency: 5 out of 7 days OT Duration/Estimated Length of Stay: 14-17 days OT Treatment/Interventions: Balance/vestibular training;Functional mobility training;Patient/family education;Therapeutic Exercise;Therapeutic Activities;UE/LE Coordination activities;Neuromuscular re-education;DME/adaptive equipment instruction;Discharge planning;Self Care/advanced ADL retraining OT Self Feeding Anticipated Outcome(s): Mod I OT Basic Self-Care Anticipated Outcome(s): Supervision OT Toileting Anticipated Outcome(s): Supervision OT Bathroom Transfers Anticipated Outcome(s): Superviskon OT Recommendation Patient destination: Home Follow Up Recommendations: Home health OT Equipment Recommended: To be  determined   Skilled Therapeutic Intervention OT 1:1 evaluation with treatment provided to address right inattention, static standing balance, transfers, w/c mobility, toileting, bathing, and improved coordination of RUE.   Pt required moderate instructional cues and overall mod assist to complete sit<>stand using bed rails and transfer to toilet and tub bench using grab bars from w/c.  Pt aware of sensory deficits contributing to decreased [facility] but continues to attempt use of extremity with cues to attend to right.  Pt toileted, bathed at shower, and dressed while seated although standing only for transers.  No clothing available during this session.  Pt left in w/c at end of session with all needs within reach.  OT Evaluation Precautions/Restrictions  Precautions Precautions: Fall Restrictions Weight Bearing Restrictions: No  General Chart Reviewed: Yes Family/Caregiver Present: No  Vital Signs Therapy Vitals BP: (!) 144/92 mmHg Patient Position (if appropriate): Sitting  Pain Pain Assessment Pain Assessment: No/denies pain  Home Living/Prior Functioning Home Living Available Help at Discharge: Friend(s), Available PRN/intermittently Type of Home: Apartment (Owns a house [place] a [place] family farm in [place] to [place]) Home Access: Level entry Entrance Stairs-Number of Steps: Level from street to entry, then to elevator.  Stairway is optional, per pateint. 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    Lives With: Alone IADL History Homemaking Responsibilities: Yes Meal Prep Responsibility: Primary Laundry Responsibility: Primary Cleaning Responsibility: Primary Bill Paying/Finance Responsibility: Primary Shopping Responsibility: Primary Child Care Responsibility:  No Homemaking Comments: Admits to decline in function over the past 2 months Current License: Yes Mode of Transportation: Car ([place]) Education: HS,  Occupation: On disability Type of Occupation: Clinical biochemist, Building control surveyor (stick, [employer]). Leisure and Hobbies: Piddle around projects on the farm, Publishing copy,  Prior Function Level of Independence: Independent with gait, Independent with basic ADLs, Independent with transfers  Able to Take Stairs?: Yes Driving: Yes Vocation: On disability  ADL ADL ADL Comments: See Functional Assessment Tool  Vision/Perception  Vision- History Patient Visual Report: Blurring of vision;Other (comment) Vision- Assessment Vision Assessment?: Vision impaired- to be further tested in functional context Additional Comments: Possible right visual field cut.   Cognition Overall Cognitive Status: Within Functional Limits for tasks assessed Arousal/Alertness: Awake/alert Orientation Level: Person;Place;Situation Person: Oriented Place: Oriented Situation: Oriented Year: 2016 Month: December Day of Week: Correct Memory: Appears intact Immediate Memory Recall: Sock;Blue;Bed Memory Recall: Sock;Blue;Bed Memory Recall Sock: Without Cue Memory Recall Blue: Without Cue Memory Recall Bed: With Cue Attention: Sustained Sustained Attention: Appears intact Awareness: Appears intact Problem Solving: Impaired Problem Solving Impairment: Functional basic Executive Function: Self Monitoring;Self Correcting;Organizing;Initiating Organizing: Impaired Organizing Impairment: Functional basic Initiating: Impaired Initiating Impairment: Functional basic Self Monitoring: Impaired Self Monitoring Impairment: Functional complex Self Correcting: Impaired Self Correcting Impairment: Functional complex Behaviors: Impulsive Safety/Judgment: Appears intact Comments: Decline in cognition noted during second session, possibly d/t fatigue.      Sensation Sensation Light Touch: Impaired Detail Light Touch Impaired Details: Impaired RLE;Impaired RUE Proprioception: Impaired Detail Proprioception Impaired Details: Impaired RUE;Impaired RLE Coordination Gross Motor Movements are Fluid and Coordinated: No Fine Motor Movements are Fluid and Coordinated: No Coordination and Movement Description: dysmetria R UE and LE  Motor  Motor Motor: Hemiplegia Motor - Skilled Clinical Observations: R sided weakness  Mobility  Transfers Transfers: Sit to Stand;Stand to Sit Sit to Stand: 3: Mod assist Sit to Stand Details: Verbal cues for technique;Tactile cues for placement;Visual cues for safe use of DME/AE Stand to Sit: 3: Mod assist Stand to Sit Details (indicate cue type and reason): Verbal cues for technique;Visual cues for safe use of DME/AE;Tactile cues for placement   Trunk/Postural Assessment  Cervical Assessment Cervical Assessment: Within Functional Limits Thoracic Assessment Thoracic Assessment:  Within Functional Limits Lumbar Assessment Lumbar Assessment: Within Functional Limits Postural Control Postural Control: Within Functional Limits Righting Reactions: delayed Protective Responses: delayed   Balance Balance Balance Assessed: Yes Dynamic Standing Balance Dynamic Standing - Comments: +2 assist for safety wtih dynamic standing activities  Extremity/Trunk Assessment RUE Assessment RUE Assessment: Exceptions to [facility] RUE Strength RUE Overall Strength: Deficits (Mildly impaired grip strength and at shoulders compared to left UE) LUE Assessment LUE Assessment: Within Functional Limits   See Function Navigator for Current Functional Status.   Refer to Care Plan for Long Term Goals  Recommendations for other services: None  Discharge Criteria: Patient will be discharged from OT if patient refuses treatment 3 consecutive times without medical reason, if treatment goals not met, if there is a change in medical  status, if patient makes no progress towards goals or if patient is discharged from hospital.  The above assessment, treatment plan, treatment alternatives and goals were discussed and mutually agreed upon: by patient   Second session: Time: 1300-1415 Time Calculation (min):  75 min  Pain Assessment: No/denies pain  Skilled Therapeutic Interventions: ADL-retraining (30 min) with focus on self-feeding, attention to right, right UE coordination and [place].   Pt completed self-feeding with moderate cues to attend to his right, hand-over-hand guidance to use right hand as stabilizer and scoop food, and setup to cut food (pulled pork).   Pt demo's poor awareness and attention during this session due to increased lethargy/fatigue; pt re-educated on head turns to look to his right d/t compensating by simply moving his plate to his left.   Therapeutic activity (30 min) with focus on improved coordination and Dana of right hand and functional cognition (use of cell phone, call light, and touch pad phone to request meals).   Pt requires repeated instruction d/t poor problem-solving and self-monitoring complicated by possible visual field cut and right UE ataxia.    Pt requested return to bed at end of session d/t fatigue.   RN alerted to need for assist for self-feeding.   Safety plan updated to reflect need for quick release belt while seated in w/c.   See FIM for current functional status  Therapy/Group: Individual Therapy  Destan Franchini 05/22/2015, 11:26 AM

## 2015-05-22 NOTE — Plan of Care (Signed)
Problem: RH SAFETY Goal: RH STG ADHERE TO SAFETY PRECAUTIONS W/ASSISTANCE/DEVICE STG Adhere to Safety Precautions With Assistance/Device. Supervision  Outcome: Progressing No unsafe behavior evident

## 2015-05-22 NOTE — Progress Notes (Signed)
Subjective/Complaints: Incontinent of stool last noc, no abd pain nausea or vomiting, denies diarrhea No bladder issies reported ROS- denies SOB, CP or sleep disturbance  Objective: Vital Signs: Blood pressure 132/51, pulse 74, temperature 98.1 F (36.7 C), temperature source Oral, resp. rate 16, height 6' (1.829 m), weight 166.017 kg (366 lb), SpO2 95 %. Ct Head Wo Contrast  05/20/2015  CLINICAL DATA:  Acute onset of worsening aphasia, and decreased right-sided sensation. Follow-up acute infarct. Subsequent encounter. EXAM: CT HEAD WITHOUT CONTRAST TECHNIQUE: Contiguous axial images were obtained from the base of the skull through the vertex without intravenous contrast. COMPARISON:  CTA of the head and neck performed 05/19/2015 FINDINGS: The acute evolving left temporal, parietal and occipital lobe infarct is perhaps slightly more diffuse and better defined than on the prior CTA. There is no definite evidence of hemorrhagic transformation at this time. Mild mass effect is seen, without significant midline shift. There is suggestion of slightly better defined acute lacunar infarct at the left thalamus. Mild periventricular white matter change likely reflects small vessel ischemic microangiopathy. The posterior fossa, including the cerebellum, brainstem and fourth ventricle, is within normal limits. There is no evidence of fracture; visualized osseous structures are unremarkable in appearance. The visualized portions of the orbits are within normal limits. The paranasal sinuses and mastoid air cells are well-aerated. No significant soft tissue abnormalities are seen. IMPRESSION: 1. No acute intracranial pathology seen on CT. 2. Acute evolving left temporal, parietal and occipital lobe infarct is perhaps slightly more diffuse and better defined than on the prior CTA. No evidence of hemorrhagic transformation at this time. Mild mass effect, without significant midline shift. 3. Suggestion of slightly  better defined acute lacunar infarct at the left thalamus. 4. Mild small vessel ischemic microangiopathy. Electronically Signed   By: Garald Balding M.D.   On: 05/20/2015 22:15   Results for orders placed or performed during the hospital encounter of 05/21/15 (from the past 72 hour(s))  CBC     Status: None   Collection Time: 05/21/15  4:38 PM  Result Value Ref Range   WBC 6.7 4.0 - 10.5 K/uL   RBC 4.85 4.22 - 5.81 MIL/uL   Hemoglobin 15.7 13.0 - 17.0 g/dL   HCT 45.3 39.0 - 52.0 %   MCV 93.4 78.0 - 100.0 fL   MCH 32.4 26.0 - 34.0 pg   MCHC 34.7 30.0 - 36.0 g/dL   RDW 12.7 11.5 - 15.5 %   Platelets 232 150 - 400 K/uL  Creatinine, serum     Status: Abnormal   Collection Time: 05/21/15  4:38 PM  Result Value Ref Range   Creatinine, Ser 1.25 (H) 0.61 - 1.24 mg/dL   GFR calc non Af Amer >60 >60 mL/min   GFR calc Af Amer >60 >60 mL/min    Comment: (NOTE) The eGFR has been calculated using the CKD EPI equation. This calculation has not been validated in all clinical situations. eGFR's persistently <60 mL/min signify possible Chronic Kidney Disease.   CBC WITH DIFFERENTIAL     Status: None   Collection Time: 05/22/15  5:28 AM  Result Value Ref Range   WBC 6.7 4.0 - 10.5 K/uL   RBC 4.80 4.22 - 5.81 MIL/uL   Hemoglobin 14.9 13.0 - 17.0 g/dL   HCT 45.8 39.0 - 52.0 %   MCV 95.4 78.0 - 100.0 fL   MCH 31.0 26.0 - 34.0 pg   MCHC 32.5 30.0 - 36.0 g/dL   RDW 12.7 11.5 -  15.5 %   Platelets 238 150 - 400 K/uL   Neutrophils Relative % 54 %   Neutro Abs 3.7 1.7 - 7.7 K/uL   Lymphocytes Relative 30 %   Lymphs Abs 2.0 0.7 - 4.0 K/uL   Monocytes Relative 12 %   Monocytes Absolute 0.8 0.1 - 1.0 K/uL   Eosinophils Relative 3 %   Eosinophils Absolute 0.2 0.0 - 0.7 K/uL   Basophils Relative 1 %   Basophils Absolute 0.0 0.0 - 0.1 K/uL  Comprehensive metabolic panel     Status: Abnormal   Collection Time: 05/22/15  5:28 AM  Result Value Ref Range   Sodium 138 135 - 145 mmol/L   Potassium 4.4  3.5 - 5.1 mmol/L   Chloride 104 101 - 111 mmol/L   CO2 25 22 - 32 mmol/L   Glucose, Bld 98 65 - 99 mg/dL   BUN 10 6 - 20 mg/dL   Creatinine, Ser 1.19 0.61 - 1.24 mg/dL   Calcium 9.5 8.9 - 10.3 mg/dL   Total Protein 7.1 6.5 - 8.1 g/dL   Albumin 3.6 3.5 - 5.0 g/dL   AST 36 15 - 41 U/L   ALT 49 17 - 63 U/L   Alkaline Phosphatase 24 (L) 38 - 126 U/L   Total Bilirubin 1.1 0.3 - 1.2 mg/dL   GFR calc non Af Amer >60 >60 mL/min   GFR calc Af Amer >60 >60 mL/min    Comment: (NOTE) The eGFR has been calculated using the CKD EPI equation. This calculation has not been validated in all clinical situations. eGFR's persistently <60 mL/min signify possible Chronic Kidney Disease.    Anion gap 9 5 - 15     HEENT: poor dentition Cardio: RRR and no murmur Resp: CTA B/L and unlabored GI: BS positive and not tender Extremity:  No Edema Skin:   Other intact in UE and LE Neuro: Alert/Oriented, Abnormal Sensory absent sensation Right foot, reduced in R hand, Abnormal Motor 4- RUE and RLE, Inattention and Other Right field cut Musc/Skel:  Other no pain with UE or LE ROM Gen NAD   Assessment/Plan: 1. 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 Physiatrist is providing close team supervision and 24 hour management of active medical problems listed below. Physiatrist and rehab team continue to assess barriers to discharge/monitor patient progress toward functional and medical goals. FIM:                   Function - Comprehension Comprehension: Auditory Comprehension assist level: Understands basic 50 - 74% of the time/ requires cueing 25 - 49% of the time  Function - Expression Expression: Verbal Expression assist level: Expresses basic 75 - 89% of the  time/requires cueing 10 - 24% of the time. Needs helper to occlude trach/needs to repeat words.  Function - Social Interaction Social Interaction assist level: Interacts appropriately 75 - 89% of the time - Needs redirection for appropriate language or to initiate interaction.  Function - Problem Solving Problem solving assist level: Solves basic 50 - 74% of the time/requires cueing 25 - 49% of the time  Function - Memory Memory assist level: Recognizes or recalls 50 - 74% of the time/requires cueing 25 - 49% of the time Patient normally able to recall (first 3 days only): Current season, That he or she is  in a hospital  Medical Problem List and Plan: 1. Right side weakness and altered mental status secondary to left PCA/temporal lobe infarct 2. DVT Prophylaxis/Anticoagulation: Subcutaneous Lovenox for DVT prophylaxis 3. Pain Management: Tylenol as needed 4. History of hypertension. Norvasc 5 mg daily. Monitor with increased mobility-132/51, CVA risk factor 5. Neuropsych: This patient is capable of making decisions on his own behalf. 6. Skin/Wound Care: Routine skin checks 7. Fluids/Electrolytes/Nutrition: Routine I&O with follow-up chemistries 8. Morbid obesity. Dietary consult 9. Hyperlipidemia. Lipitor- CVA risk factor 10. Tobacco abuse. Nicoderm patch. Provide counseling, CVA risk factor LABs reviewed and normal LOS (Days) 1 A FACE TO FACE EVALUATION WAS PERFORMED  KIRSTEINS,ANDREW E 05/22/2015, 7:31 AM

## 2015-05-22 NOTE — Plan of Care (Signed)
Problem: RH PAIN MANAGEMENT Goal: RH STG PAIN MANAGED AT OR BELOW PT'S PAIN GOAL <3 Outcome: Progressing No c/o pain     

## 2015-05-22 NOTE — Care Management Note (Signed)
[facility] Individual Statement of Services  Patient Name:  Bryan Schultz  Date:  05/22/2015  Welcome [facility] the [facility].  Our goal is to provide you with an individualized program based on your diagnosis and situation, designed to meet your specific needs.  With this comprehensive rehabilitation program, you will be expected to participate in at least 3 hours of rehabilitation therapies Monday-Friday, with modified therapy programming on the weekends.  Your rehabilitation program will include the following services:  Physical Therapy (PT), Occupational Therapy (OT), 24 hour per day rehabilitation nursing, Therapeutic Recreaction (TR), Neuropsychology, Case Management (Social Worker), Rehabilitation Medicine, Nutrition Services and Pharmacy Services  Weekly team conferences will be held on Wednesday to discuss your progress.  Your Social Worker will talk with you frequently to get your input and to update you on team discussions.  Team conferences with you and your family in attendance may also be held.  Expected length of stay: 12-16 days  Overall anticipated outcome: supervision level  Depending on your progress and recovery, your program may change. Your Social Worker will coordinate services and will keep you informed of any changes. Your Social Worker's name and contact numbers are listed  below.  The following services may also be recommended but are not provided by the [facility]:   Driving Evaluations  Home Health Rehabiltiation Services  Outpatient Rehabilitation Services    Arrangements will be made to provide these services after discharge if needed.  Arrangements include referral to agencies that provide these services.  Your insurance has been verified to be:  Medicaid Your primary doctor is:  [name]  Pertinent information will be shared with your doctor and your insurance company.  Social Worker:  Dossie DerBecky  [place], [place] [phone] or [phone]  Information discussed with and copy given to patient by: Lucy Chrisupree, Eileen Croswell G, 05/22/2015, 10:27 AM

## 2015-05-23 ENCOUNTER — Inpatient Hospital Stay (HOSPITAL_COMMUNITY): Payer: Medicaid Other | Admitting: Occupational Therapy

## 2015-05-23 ENCOUNTER — Inpatient Hospital Stay (HOSPITAL_COMMUNITY): Payer: Medicaid Other

## 2015-05-23 NOTE — Progress Notes (Signed)
Physical Therapy Session Note  Patient Details  Name: Bryan Schultz MRN: [id number] Date of Birth: 10/08/1955  Today's Date: 05/23/2015 PT Individual Time: 0800-0905; [id number] PT Individual Time Calculation (min): 65 min , 48 min  Short Term Goals: Week 1:  PT Short Term Goal 1 (Week 1): Pt will perform functional transfers wtih min A PT Short Term Goal 2 (Week 1): Pt will gait with mod A 50' in controlled environment  Skilled Therapeutic Interventions/Progress Updates:  tx 1:EOB > w/c to R with max assist for RUE and RLE placement/safety.  Toilet transfer' hygiene +2 for balance and cleaning; transfer toilet >< w/c with mod assist. Simulated car transfer with mod assist to enter to L, max assist to exit to R due to inability to attend to RUE and RLE and extend RLe. neuromuscular re-education via demo, visual feedback, VCS for bil hip adduction and IR in sitting, against resistance of towel roll, x 10 with 5 second holds, manipulation of R brake with R hand, and RLE foot on/off footrest, . PT returned pt to room and left with all needs in place; quick release belt donned and falls risk discussed with pt.  tx 2:  neuromuscular re-education via visual feedback, mirror feedback, demo and manual cues for R stance stanbility in standing in parallel bars, x 10 x 2, seated bil hip add/hip internal rotation to improve hip/knee/ankle alignment for sit><stand; seated R long arc knee ext x 5, limited by quad endurance.  Gait with wide RW x 16' with mod assist for progression of RW, wt shifting and mod VCs for technique, x 12' 2nd trial limited by R knee buckling repeatedly requiring max assist.  2nd person needed for w/c follow for safety. PT returned pt to room and all needs left within reach of him sitting in w/c. Falls risk discussed again with pt; quick release belt donned.    Therapy Documentation Precautions:  Precautions Precautions: Fall Restrictions Weight Bearing Restrictions: No    Pain: Pain Assessment Pain Assessment: No/denies pain          See Function Navigator for Current Functional Status.   Therapy/Group: Individual Therapy  Shaleigh Laubscher 05/23/2015, 9:31 AM

## 2015-05-23 NOTE — Progress Notes (Addendum)
Subjective/Complaints: Denies bowel incont ROS- denies SOB, CP or sleep disturbance  Objective: Vital Signs: Blood pressure 122/49, pulse 74, temperature 97.8 F (36.6 C), temperature source Oral, resp. rate 17, height 6' (1.829 m), weight 166.017 kg (366 lb), SpO2 95 %. No results found. Results for orders placed or performed during the hospital encounter of 05/21/15 (from the past 72 hour(s))  CBC     Status: None   Collection Time: 05/21/15  4:38 PM  Result Value Ref Range   WBC 6.7 4.0 - 10.5 K/uL   RBC 4.85 4.22 - 5.81 MIL/uL   Hemoglobin 15.7 13.0 - 17.0 g/dL   HCT 45.3 39.0 - 52.0 %   MCV 93.4 78.0 - 100.0 fL   MCH 32.4 26.0 - 34.0 pg   MCHC 34.7 30.0 - 36.0 g/dL   RDW 12.7 11.5 - 15.5 %   Platelets 232 150 - 400 K/uL  Creatinine, serum     Status: Abnormal   Collection Time: 05/21/15  4:38 PM  Result Value Ref Range   Creatinine, Ser 1.25 (H) 0.61 - 1.24 mg/dL   GFR calc non Af Amer >60 >60 mL/min   GFR calc Af Amer >60 >60 mL/min    Comment: (NOTE) The eGFR has been calculated using the CKD EPI equation. This calculation has not been validated in all clinical situations. eGFR's persistently <60 mL/min signify possible Chronic Kidney Disease.   CBC WITH DIFFERENTIAL     Status: None   Collection Time: 05/22/15  5:28 AM  Result Value Ref Range   WBC 6.7 4.0 - 10.5 K/uL   RBC 4.80 4.22 - 5.81 MIL/uL   Hemoglobin 14.9 13.0 - 17.0 g/dL   HCT 45.8 39.0 - 52.0 %   MCV 95.4 78.0 - 100.0 fL   MCH 31.0 26.0 - 34.0 pg   MCHC 32.5 30.0 - 36.0 g/dL   RDW 12.7 11.5 - 15.5 %   Platelets 238 150 - 400 K/uL   Neutrophils Relative % 54 %   Neutro Abs 3.7 1.7 - 7.7 K/uL   Lymphocytes Relative 30 %   Lymphs Abs 2.0 0.7 - 4.0 K/uL   Monocytes Relative 12 %   Monocytes Absolute 0.8 0.1 - 1.0 K/uL   Eosinophils Relative 3 %   Eosinophils Absolute 0.2 0.0 - 0.7 K/uL   Basophils Relative 1 %   Basophils Absolute 0.0 0.0 - 0.1 K/uL  Comprehensive metabolic panel     Status:  Abnormal   Collection Time: 05/22/15  5:28 AM  Result Value Ref Range   Sodium 138 135 - 145 mmol/L   Potassium 4.4 3.5 - 5.1 mmol/L   Chloride 104 101 - 111 mmol/L   CO2 25 22 - 32 mmol/L   Glucose, Bld 98 65 - 99 mg/dL   BUN 10 6 - 20 mg/dL   Creatinine, Ser 1.19 0.61 - 1.24 mg/dL   Calcium 9.5 8.9 - 10.3 mg/dL   Total Protein 7.1 6.5 - 8.1 g/dL   Albumin 3.6 3.5 - 5.0 g/dL   AST 36 15 - 41 U/L   ALT 49 17 - 63 U/L   Alkaline Phosphatase 24 (L) 38 - 126 U/L   Total Bilirubin 1.1 0.3 - 1.2 mg/dL   GFR calc non Af Amer >60 >60 mL/min   GFR calc Af Amer >60 >60 mL/min    Comment: (NOTE) The eGFR has been calculated using the CKD EPI equation. This calculation has not been validated in all clinical situations.  eGFR's persistently <60 mL/min signify possible Chronic Kidney Disease.    Anion gap 9 5 - 15     HEENT: poor dentition Cardio: RRR and no murmur Resp: CTA B/L and unlabored GI: BS positive and not tender Extremity:  No Edema Skin:   Other intact in UE and LE Neuro: Alert/Oriented to person and place, month day but not date (26th) Abnormal Sensory absent sensation Right foot, reduced in R hand, Abnormal Motor 4- RUE and RLE, Inattention and Other Right field cut Musc/Skel:  Other no pain with UE or LE ROM Gen NAD   Assessment/Plan: 1. 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 Physiatrist is providing close team supervision and 24 hour management of active medical problems listed below. Physiatrist and rehab team continue to assess barriers to discharge/monitor patient progress toward functional and medical goals. FIM: Function - Bathing Position: Shower Body parts bathed by patient: Right arm, Left arm, Chest, Abdomen, Front perineal area,  Right upper leg, Left upper leg Body parts bathed by helper: Buttocks, Back Bathing not applicable: Right lower leg, Left lower leg Assist Level: Touching or steadying assistance(Pt > 75%)  Function- Upper Body Dressing/Undressing What is the patient wearing?: Hospital gown Function - Lower Body Dressing/Undressing What is the patient wearing?: Hospital Gown, Non-skid slipper socks Non-skid slipper socks- Performed by helper: Don/doff right sock, Don/doff left sock  Function - Toileting Toileting steps completed by patient: Adjust clothing prior to toileting Toileting steps completed by helper: Performs perineal hygiene, Adjust clothing after toileting Assist level: Touching or steadying assistance (Pt.75%)  Function - Toilet Transfers Toilet transfer assistive device: Elevated toilet seat/BSC over toilet, Grab bar Assist level to toilet: Moderate assist (Pt 50 - 74%/lift or lower) Assist level from toilet: Moderate assist (Pt 50 - 74%/lift or lower)  Function - Chair/bed transfer Chair/bed transfer assist level: Maximal assist (Pt 25 - 49%/lift and lower)  Function - Locomotion: Wheelchair Will patient use wheelchair at discharge?: No Type: Manual Max wheelchair distance: 75 Assist Level: Supervision or verbal cues Assist Level: Supervision or verbal cues Wheel 150 feet activity did not occur: Safety/medical concerns Turns around,maneuvers to table,bed, and toilet,negotiates 3% grade,maneuvers on rugs and over doorsills: No Function - Locomotion: Ambulation Assistive device: No device Max distance: 18 Assist level: 2 helpers Walk 50 feet with 2 turns activity did not occur: Safety/medical concerns Walk 150 feet activity did not occur: Safety/medical concerns Walk 10 feet on uneven surfaces activity did not occur: Safety/medical concerns  Function - Comprehension Comprehension: Auditory Comprehension assist level: Understands complex 90% of the time/cues 10% of the  time  Function - Expression Expression: Verbal Expression assist level: Expresses complex 90% of the time/cues < 10% of the time  Function - Social Interaction Social Interaction assist level: Interacts appropriately 90% of the time - Needs monitoring or encouragement for participation or interaction.  Function - Problem Solving Problem solving assist level: Solves basic 75 - 89% of the time/requires cueing 10 - 24% of the time  Function - Memory Memory assist level: More than reasonable amount of time Patient normally able to recall (first 3 days only): Current season, Location of own room, That he or she is in a hospital  Medical Problem List and Plan: 1. Right side hemisensory deficits, reduced coordination and altered mental status secondary to left PCA/temporal lobe infarct-Team  conference today please see physician documentation under team conference tab, met with team face-to-face to discuss problems,progress, and goals. Formulized individual treatment plan based on medical history, underlying problem and comorbidities. 2. DVT Prophylaxis/Anticoagulation: Subcutaneous Lovenox for DVT prophylaxis 3. Pain Management: Tylenol as needed 4. History of hypertension. Norvasc 5 mg daily. Monitor with increased mobility-122/49, CVA risk factor 5. Neuropsych: This patient is capable of making decisions on his own behalf. 6. Skin/Wound Care: Routine skin checks 7. Fluids/Electrolytes/Nutrition: Routine I&O normal BMET 12/27 8. Morbid obesity. Dietary consult 9. Hyperlipidemia. Lipitor- CVA risk factor 10. Tobacco abuse. Nicoderm patch. Provide counseling, CVA risk factor  LOS (Days) 2 A FACE TO FACE EVALUATION WAS PERFORMED  KIRSTEINS,ANDREW E 05/23/2015, 7:39 AM

## 2015-05-23 NOTE — Progress Notes (Signed)
Occupational Therapy Session Note  Patient Details  Name: Bryan Schultz MRN: [id number] Date of Birth: 06/17/1955  Today's Date: 05/23/2015 OT Individual Time: 1000-1030 OT Individual Time Calculation (min): 30 min    Short Term Goals: Week 1:  OT Short Term Goal 1 (Week 1): Pt will complete tub bench transfer in tub/shower with mod assist OT Short Term Goal 2 (Week 1): Pt will complete bathing seated using AE to reach to feet and back with min assist OT Short Term Goal 3 (Week 1): Pt will demo ability to complete 3 of 4 grooming tasks using right hand as dominant with supervision OT Short Term Goal 4 (Week 1): Pt will complete toileting with mod assist for thoroughness OT Short Term Goal 5 (Week 1): Pt will dress lower body using AE with mod assist.  Skilled Therapeutic Interventions/Progress Updates:    Pt worked on functional mobility using the RW.  Min assist for sit to stand with min assist for ambulation to the door of the room with use of RW.  Pt needs mod instructional cueing to locate objects and environmental barriers right of midline.  Further tested pt's visual field as well as sensation in the right arm during session.  He was able to detect heavy pressure in the right arm and hand as well as proprioception but demonstrated decreased light touch.  Dysmetria also noted with finger to nose testing as well when compared to the left.  Educated pt on his current significant right visual field cut as well.  Pt with impaired saccadic movements, especially when attempting to locate items right of midline.  Pt left in wheelchair with safety belt in place and call button in lap.   Therapy Documentation Precautions:  Precautions Precautions: Fall Restrictions Weight Bearing Restrictions: No  Pain: Pain Assessment Pain Assessment: No/denies pain ADL: ADL ADL Comments: See Functional Assessment Tool Exercises: See Function Navigator for Current Functional Status.   Therapy/Group:  Individual Therapy  Kendalynn Wideman OTR/L 05/23/2015, 12:36 PM

## 2015-05-23 NOTE — Progress Notes (Signed)
Occupational Therapy Session Note  Patient Details  Name: Bryan Schultz MRN: [id number] Date of Birth: 06/24/1955  Today's Date: 05/23/2015 OT Individual Time: 1300-1416 OT Individual Time Calculation (min): 76 min    Skilled Therapeutic Interventions/Progress Updates:    Pt worked on bathing and dressing during session.  Completed transfer into and out of the shower with the RW and mod assist.  Increased lean to the right in standing with right knee slowly buckling as well.  This was noted as being more severe when standing in the shower to wash his peri area.  Decreased thoroughness for reaching his buttocks with the RUE as well during session.  Provided AE for donning pants and gripper socks as he cannot reach down to his feet.  Also provided LH sponge for use with LB bathing as well for use on next visit.  Incorporated visual scanning to the right for locating shower head and washcloth as well as clothing during dressing.  Mod instructional cueing for head turns to the right.  Pt left in wheelchair at end of session with safety belt in place.    Therapy Documentation Precautions:  Precautions Precautions: Fall Restrictions Weight Bearing Restrictions: No  Pain: Pain Assessment Pain Assessment: No/denies pain ADL: ADL ADL Comments: See Functional Assessment Tool  See Function Navigator for Current Functional Status.   Therapy/Group: Individual Therapy  Akina Maish OTR/L 05/23/2015, 4:12 PM

## 2015-05-23 NOTE — Patient Care Conference (Signed)
Inpatient RehabilitationTeam Conference and Plan of Care Update Date: 05/23/2015   Time: 11:15 AM    Patient Name: Bryan Schultz      Medical Record Number: [id number]  Date of Birth: June 03, 1955 Sex: Male         Room/Bed: 4M06C/4M06C-01 Payor Info: Payor: MEDICAID [place] / Plan: MEDICAID [place] ACCESS / Product Type: *No Product type* /    Admitting Diagnosis: cva  Admit Date/Time:  05/21/2015  3:05 PM Admission Comments: No comment available   Primary Diagnosis:  <principal problem not specified> Principal Problem: <principal problem not specified>  Patient Active Problem List   Diagnosis Date Noted  . 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 Essential hypertension   . Morbid obesity (HCC)   . HLD (hyperlipidemia)   . Tobacco abuse   . Dysmetria   . Ataxia, late effect of cerebrovascular disease   . CVA (cerebral vascular accident) (HCC) 05/19/2015  . Acute CVA (cerebrovascular accident) (HCC) 05/18/2015    Expected Discharge Date: Expected Discharge Date: 06/05/15  Team Members Present: Physician leading conference: Dr. Claudette Laws Social Worker Present: Dossie Der, LCSW Nurse Present: Chana Bode, RN PT Present: Wanda Plump, PT OT Present: Perrin Maltese, OT SLP Present: Jackalyn Lombard, SLP PPS Coordinator present : Tora Duck, RN, CRRN     Current Status/Progress Goal Weekly Team Focus  Medical   unable to do toilet hygiene, RUE sensory deficits  home with friend assist, improve R side awareness  Cue to R side in all therapies   Bowel/Bladder   Incontinent of bowel. LBM 05/22/15  Bladder urgency. Pt can spill urine at times  Manage bowel and bladder  Timed toileting q 2hrs   Swallow/Nutrition/ Hydration     na        ADL's   Overall Mod assist with BADL and transfers (no clothing during OT eval)  Mod I  toileting, sitting balance, and  self-feeding; supervision BADL and transfers  Endurance, NMR of RUE, attention/awareness, AE training, dynamic sitting/standing balance, coordination of BUE.   Mobility   mod>max stand step transfers, supervision w/c x 75', +2 gait x 18' without AD, mod assist 2 steps 2 rails  supervision overalll including gait x 150' and stairs 2 rails   neuro re-ed, pre-gait/gait, safety, pt ed   Communication     na        Safety/Cognition/ Behavioral Observations    no unsafe behaviors        Pain   No c/o pain  <3  Monitor for nonverbal cues of pain   Skin   Scrotum red, buttock-pink, blanchable  CDI  Encourage turn q2hrs      *See Care Plan and progress notes for long and short-term goals.  Barriers to Discharge: morbid obesity    Possible Resolutions to Barriers:  Cont rehab, identify and train caregiver    Discharge Planning/Teaching Needs:  Plans to go to friend-April she can provide 24 hr supervision due to her own health issues.      Team Discussion:  Goals-supervision level. Right field cut and inattention which limits him and make shim unsafe. Timed toileting every 2 hrs. Has been continent. Pt is a big man so hopefully will reach stated goals-apartment not wheelchair accessible.  Revisions to Treatment Plan:  None   Continued Need for Acute Rehabilitation Level of Care: The patient requires daily medical management by a physician  with specialized training in physical medicine and rehabilitation for the following conditions: Daily direction of a multidisciplinary physical rehabilitation program to ensure safe treatment while eliciting the highest outcome that is of practical value to the patient.: Yes Daily medical management of patient stability for increased activity during participation in an intensive rehabilitation regime.: Yes Daily analysis of laboratory values and/or radiology reports with any subsequent need for medication adjustment of medical intervention for :  Neurological problems;Other  Lucy ChrisDupree, Rickelle Sylvestre G 05/23/2015, 12:37 PM

## 2015-05-24 ENCOUNTER — Inpatient Hospital Stay (HOSPITAL_COMMUNITY): Payer: Medicaid Other | Admitting: Occupational Therapy

## 2015-05-24 ENCOUNTER — Inpatient Hospital Stay (HOSPITAL_COMMUNITY): Payer: Medicaid Other

## 2015-05-24 NOTE — Progress Notes (Signed)
Subjective/Complaints: Denies bowel incont ROS- denies SOB, CP or sleep disturbance  Objective: Vital Signs: Blood pressure 137/73, pulse 84, temperature 98.7 F (37.1 C), temperature source Oral, resp. rate 18, height _0  (1.854 m), weight 167.559 kg (369 lb 6.4 oz), SpO2 95 %. No results found. Results for orders placed or performed during the hospital encounter of 05/21/15 (from the past 72 hour(s))  CBC     Status: None   Collection Time: 05/21/15  4:38 PM  Result Value Ref Range   WBC 6.7 4.0 - 10.5 K/uL   RBC 4.85 4.22 - 5.81 MIL/uL   Hemoglobin 15.7 13.0 - 17.0 g/dL   HCT 45.3 39.0 - 52.0 %   MCV 93.4 78.0 - 100.0 fL   MCH 32.4 26.0 - 34.0 pg   MCHC 34.7 30.0 - 36.0 g/dL   RDW 12.7 11.5 - 15.5 %   Platelets 232 150 - 400 K/uL  Creatinine, serum     Status: Abnormal   Collection Time: 05/21/15  4:38 PM  Result Value Ref Range   Creatinine, Ser 1.25 (H) 0.61 - 1.24 mg/dL   GFR calc non Af Amer >60 >60 mL/min   GFR calc Af Amer >60 >60 mL/min    Comment: (NOTE) The eGFR has been calculated using the CKD EPI equation. This calculation has not been validated in all clinical situations. eGFR's persistently <60 mL/min signify possible Chronic Kidney Disease.   CBC WITH DIFFERENTIAL     Status: None   Collection Time: 05/22/15  5:28 AM  Result Value Ref Range   WBC 6.7 4.0 - 10.5 K/uL   RBC 4.80 4.22 - 5.81 MIL/uL   Hemoglobin 14.9 13.0 - 17.0 g/dL   HCT 45.8 39.0 - 52.0 %   MCV 95.4 78.0 - 100.0 fL   MCH 31.0 26.0 - 34.0 pg   MCHC 32.5 30.0 - 36.0 g/dL   RDW 12.7 11.5 - 15.5 %   Platelets 238 150 - 400 K/uL   Neutrophils Relative % 54 %   Neutro Abs 3.7 1.7 - 7.7 K/uL   Lymphocytes Relative 30 %   Lymphs Abs 2.0 0.7 - 4.0 K/uL   Monocytes Relative 12 %   Monocytes Absolute 0.8 0.1 - 1.0 K/uL   Eosinophils Relative 3 %   Eosinophils Absolute 0.2 0.0 - 0.7 K/uL   Basophils Relative 1 %   Basophils Absolute 0.0 0.0 - 0.1 K/uL  Comprehensive metabolic panel      Status: Abnormal   Collection Time: 05/22/15  5:28 AM  Result Value Ref Range   Sodium 138 135 - 145 mmol/L   Potassium 4.4 3.5 - 5.1 mmol/L   Chloride 104 101 - 111 mmol/L   CO2 25 22 - 32 mmol/L   Glucose, Bld 98 65 - 99 mg/dL   BUN 10 6 - 20 mg/dL   Creatinine, Ser 1.19 0.61 - 1.24 mg/dL   Calcium 9.5 8.9 - 10.3 mg/dL   Total Protein 7.1 6.5 - 8.1 g/dL   Albumin 3.6 3.5 - 5.0 g/dL   AST 36 15 - 41 U/L   ALT 49 17 - 63 U/L   Alkaline Phosphatase 24 (L) 38 - 126 U/L   Total Bilirubin 1.1 0.3 - 1.2 mg/dL   GFR calc non Af Amer >60 >60 mL/min   GFR calc Af Amer >60 >60 mL/min    Comment: (NOTE) The eGFR has been calculated using the CKD EPI equation. This calculation has not been validated in  all clinical situations. eGFR's persistently <60 mL/min signify possible Chronic Kidney Disease.    Anion gap 9 5 - 15     HEENT: poor dentition Cardio: RRR and no murmur Resp: CTA B/L and unlabored GI: BS positive and not tender Extremity:  No Edema Skin:   Other intact in UE and LE Neuro: Alert/Oriented to person and place, month day but not date (26th) Abnormal Sensory absent sensation Right foot, reduced in R hand, Abnormal Motor 4- RUE and RLE, Inattention and Other Right field cut Musc/Skel:  Other no pain with UE or LE ROM Gen NAD   Assessment/Plan: 1. 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 Physiatrist is providing close team supervision and 24 hour management of active medical problems listed below. Physiatrist and rehab team continue to assess barriers to discharge/monitor patient progress toward functional and medical goals.  Discussed d/c date- pt realizes that his recovery will not be a "quick fix" FIM: Function - Bathing Position:  Shower Body parts bathed by patient: Right arm, Left arm, Chest, Abdomen, Front perineal area, Right upper leg, Left upper leg Body parts bathed by helper: Back, Left lower leg, Right lower leg, Buttocks Bathing not applicable: Right lower leg, Left lower leg Assist Level: Touching or steadying assistance(Pt > 75%)  Function- Upper Body Dressing/Undressing What is the patient wearing?: Pull over shirt/dress Pull over shirt/dress - Perfomed by patient: Thread/unthread right sleeve, Thread/unthread left sleeve, Put head through opening, Pull shirt over trunk Assist Level: Supervision or verbal cues Function - Lower Body Dressing/Undressing What is the patient wearing?: Hospital Gown, Non-skid slipper socks Non-skid slipper socks- Performed by helper: Don/doff right sock, Don/doff left sock  Function - Toileting Toileting steps completed by patient: Adjust clothing prior to toileting Toileting steps completed by helper: Adjust clothing prior to toileting, Performs perineal hygiene, Adjust clothing after toileting Toileting Assistive Devices: Grab bar or rail Assist level: Two helpers  Function - Archivist transfer assistive device: Elevated toilet seat/BSC over toilet Assist level to toilet: Moderate assist (Pt 50 - 74%/lift or lower) Assist level from toilet: Moderate assist (Pt 50 - 74%/lift or lower)  Function - Chair/bed transfer Chair/bed transfer assist level: Maximal assist (Pt 25 - 49%/lift and lower)  Function - Locomotion: Wheelchair Will patient use wheelchair at discharge?: No Type: Manual Max wheelchair distance: 75 Assist Level: Supervision or verbal cues Assist Level: Supervision or verbal cues Wheel 150 feet activity did not occur: Safety/medical concerns Turns around,maneuvers to table,bed, and toilet,negotiates 3% grade,maneuvers on rugs and over doorsills: No Function - Locomotion: Ambulation Assistive device: Walker-rolling Max distance: 16 Assist  level: 2 helpers Assist level: 2 helpers Walk 50 feet with 2 turns activity did not occur: Safety/medical concerns Walk 150 feet activity did not occur: Safety/medical concerns Walk 10 feet on uneven surfaces activity did not occur: Safety/medical concerns  Function - Comprehension Comprehension: Auditory Comprehension assist level: Understands complex 90% of the time/cues 10% of the time  Function - Expression Expression: Verbal Expression assist level: Expresses complex 90% of the time/cues < 10% of the time  Function - Social Interaction Social Interaction assist level: Interacts appropriately 90% of the time - Needs monitoring or encouragement for participation or interaction.  Function - Problem Solving Problem solving assist level: Solves basic 25 - 49% of the time - needs direction more  than half the time to initiate, plan or complete simple activities  Function - Memory Memory assist level: Recognizes or recalls 25 - 49% of the time/requires cueing 50 - 75% of the time Patient normally able to recall (first 3 days only): Current season, Location of own room, That he or she is in a hospital  Medical Problem List and Plan: 1. Right side hemisensory deficits, reduced coordination and altered mental status secondary to left PCA/temporal lobe infarct- Emphasize need to scan to right 2. DVT Prophylaxis/Anticoagulation: Subcutaneous Lovenox for DVT prophylaxis 3. Pain Management: Tylenol as needed 4. History of hypertension. Norvasc 5 mg daily. Monitor with increased mobility-122/49, CVA risk factor 5. Neuropsych: This patient is capable of making decisions on his own behalf. 6. Skin/Wound Care: Routine skin checks 7. Fluids/Electrolytes/Nutrition: Routine I&O normal BMET 12/27 8. Morbid obesity. Dietary consult 9. Hyperlipidemia. Lipitor- CVA risk factor 10. Tobacco abuse. Nicoderm patch. Provide counseling, CVA risk factor  LOS (Days) 3 A FACE TO FACE EVALUATION WAS  PERFORMED  Delainie Chavana E 05/24/2015, 7:45 AM

## 2015-05-24 NOTE — Progress Notes (Signed)
Physical Therapy Session Note  Patient Details  Name: Bryan Schultz MRN: [id number] Date of Birth: 10/13/1955  Today's Date: 05/24/2015 PT Individual Time: 1435-1525 PT Individual Time Calculation (min): 50 min   Short Term Goals: Week 1:  PT Short Term Goal 1 (Week 1): Pt will perform functional transfers wtih min A PT Short Term Goal 2 (Week 1): Pt will gait with mod A 50' in controlled environment  Skilled Therapeutic Interventions/Progress Updates:  neuromuscular re-education via manual cues, forced use, VCs for R knee control in standing during wt shiftingL><R and mini squats, bil UE support. Kicking beach ball with R foot seated to facilitate ankle DF to improve R foot clearance during gait.  Gait with RW , 2 wide ACE wraps on R knee to control buckling, x 75' , x 40' including 2 turns to R, with mod assist for wt shifting, RW control, mod cues for upright posture and forward gaze. Pt returned to room; mod assist stand pivot to return to bed.  Bed alarm set and all needs left within reach.    Therapy Documentation Precautions:  Precautions Precautions: Fall Restrictions Weight Bearing Restrictions: No   Pain: Pain Assessment Pain Assessment: No/denies pain         See Function Navigator for Current Functional Status.   Therapy/Group: Individual Therapy  Roderic Lammert 05/24/2015, 3:31 PM
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
## 2015-05-24 NOTE — Progress Notes (Signed)
Social Work Patient ID: Bryan Schultz, male   DOB: [birth year], 57 y.o.   MRN: [id number] Met with pt and spoke with April-friend and daughter-Tiffany to inform team conference goals-supervision and target discharge 1/10. Pt wants to know if he can leave sooner Discussed if he has met his goals.  He needs to reach supervision level goals for April to be able to take care of him. She can not provide any physical care at discharge due to her own Health issues. All in agreement with the plan and pt is hoping he will progress more quickly they team thinks. He is working on his right neglect and inattention.  April to visit today and bring his shoes.

## 2015-05-24 NOTE — Progress Notes (Signed)
Occupational Therapy Session Note  Patient Details  Name: Bryan Schultz MRN: [id number] Date of Birth: 09/03/1955  Today's Date: 05/24/2015 OT Individual Time: [id number] OT Individual Time Calculation (min): 90 min    Short Term Goals: Week 1:  OT Short Term Goal 1 (Week 1): Pt will complete tub bench transfer in tub/shower with mod assist OT Short Term Goal 2 (Week 1): Pt will complete bathing seated using AE to reach to feet and back with min assist OT Short Term Goal 3 (Week 1): Pt will demo ability to complete 3 of 4 grooming tasks using right hand as dominant with supervision OT Short Term Goal 4 (Week 1): Pt will complete toileting with mod assist for thoroughness OT Short Term Goal 5 (Week 1): Pt will dress lower body using AE with mod assist.  Skilled Therapeutic Interventions/Progress Updates:    Pt completed bathing and dressing during session.  Mod assist for transfer to the shower with use of the RW.  Pt needs mod instructional cueing to locate barriers, clothing, and shower seat placed on the right of midline.  Bryan Schultz was able to complete most bathing with setup except for washing peri area in standing which required mod assist for balance.  Still with increased lean and LOB to the right.  Also exhibits right knee flexion in standing as well with weightbearing.  Bryan Schultz utilized LH sponge for washing his lower legs and feet as well as reacher and sockaide with min assist for LB dressing.  Pt still with significant visual field deficit noted in the right visual field.  Pt needs assistance with stabilizing sockaide for him to place gripper sock on it.  Bryan Schultz can then put the socks on his feet with supervision.    Therapy Documentation Precautions:  Precautions Precautions: Fall Restrictions Weight Bearing Restrictions: No  Pain: Pain Assessment Pain Assessment: No/denies pain ADL: See Function Navigator for Current Functional Status.   Therapy/Group: Individual Therapy  Sherrica Niehaus  OTR/L 05/24/2015, 12:43 PM

## 2015-05-24 NOTE — Plan of Care (Signed)
Problem: RH BLADDER ELIMINATION Goal: RH STG MANAGE BLADDER WITH ASSISTANCE STG Manage Bladder With Assistance. Min A  Outcome: Not Progressing Pt incont HS requiring total bed change

## 2015-05-24 NOTE — Progress Notes (Signed)
Occupational Therapy Session Note  Patient Details  Name: Bryan Schultz MRN: [id number] Date of Birth: 06/22/1955  Today's Date: 05/24/2015 OT Individual Time: 1301-1401 OT Individual Time Calculation (min): 60 min    Short Term Goals: Week 1:  OT Short Term Goal 1 (Week 1): Pt will complete tub bench transfer in tub/shower with mod assist OT Short Term Goal 2 (Week 1): Pt will complete bathing seated using AE to reach to feet and back with min assist OT Short Term Goal 3 (Week 1): Pt will demo ability to complete 3 of 4 grooming tasks using right hand as dominant with supervision OT Short Term Goal 4 (Week 1): Pt will complete toileting with mod assist for thoroughness OT Short Term Goal 5 (Week 1): Pt will dress lower body using AE with mod assist.  Skilled Therapeutic Interventions/Progress Updates:    Pt completed training on [employer] to address dynamic standing balance and visual compensation strategies with scanning to the right.  He was able to perform initial testing in sitting with location of lights left of midline with 100 % accurracy in 2-2.5 seconds.  He located approximately 50% right of midline with average time over 3- 3.5 seconds.  Pt improved during treatment with locating lights right of midline in under 3 seconds with 80 % or greater accuracy.  Worked in standing as well in treatment intervals with min assist needed for standing balance secondary to posterior bias.  He was able to locate items right of midline in 2-3 seconds in standing as well.  Transitioned back to sitting and having him use just his RUE to locate to further work on coordination.  Greater difficulty with reaching buttons and pressing secondary to dysmetria, with times increased to over 3 seconds on the right and 5 seconds on the left, secondary to having to reach across midline.  Pt taken back to room at end of session via wheelchair.  Pt's friends present but safety belt still put in place.    Therapy  Documentation Precautions:  Precautions Precautions: Fall Restrictions Weight Bearing Restrictions: No  Pain: Pain Assessment Pain Assessment: No/denies pain ADL: See Function Navigator for Current Functional Status.   Therapy/Group: Individual Therapy  Harolyn Cocker OTR/L 05/24/2015, 4:09 PM

## 2015-05-25 ENCOUNTER — Inpatient Hospital Stay (HOSPITAL_COMMUNITY): Payer: Medicaid Other

## 2015-05-25 ENCOUNTER — Inpatient Hospital Stay (HOSPITAL_COMMUNITY): Payer: Medicaid Other | Admitting: Occupational Therapy

## 2015-05-25 NOTE — Progress Notes (Signed)
Occupational Therapy Session Note  Patient Details  Name: Bryan CelesteJohn Schultz MRN: [id number] Date of Birth: 11/03/1955  Today's Date: 05/25/2015 OT Individual Time: 1130-1200 OT Individual Time Calculation (min): 30 min    Short Term Goals: Week 1:  OT Short Term Goal 1 (Week 1): Pt will complete tub bench transfer in tub/shower with mod assist OT Short Term Goal 2 (Week 1): Pt will complete bathing seated using AE to reach to feet and back with min assist OT Short Term Goal 3 (Week 1): Pt will demo ability to complete 3 of 4 grooming tasks using right hand as dominant with supervision OT Short Term Goal 4 (Week 1): Pt will complete toileting with mod assist for thoroughness OT Short Term Goal 5 (Week 1): Pt will dress lower body using AE with mod assist.  Skilled Therapeutic Interventions/Progress Updates: Therapeutic activity with focus on functional mobility using RW, dynamic standing balance, and improved coordination/FMC of RUE.   Pt ambulates from his room to RN station (approx 65') using RW with min guard assist.   Pt is escorted to kitchen and is instructed on task to stand at counter top and reach into cabinet to remove all items on first two shelves using right hand.   Pt completes task with mod vc to attend to his right when placing objects on countertop.   Pt rests and replaces items with supervision.   Pt demo's mild ataxia at RUE, complicated by impaired light touch but persists through task completion with good effort and emerging awareness of deficits.     Therapy Documentation Precautions:  Precautions Precautions: Fall Precaution Comments: RUE and RLE weakness and sensory deficits Restrictions Weight Bearing Restrictions: No  Pain: Pain Assessment Pain Assessment: No/denies pain  ADL: ADL ADL Comments: See Functional Assessment Tool  See Function Navigator for Current Functional Status.   Therapy/Group: Individual Therapy  [name] 05/25/2015, 12:16 PM

## 2015-05-25 NOTE — Progress Notes (Addendum)
Subjective/Complaints: Still with numbness on right   ROS- denies SOB, CP or sleep disturbance  Objective: Vital Signs: Blood pressure 136/84, pulse 79, temperature 98.3 F (36.8 C), temperature source Oral, resp. rate 18, height  (1.854 m), weight 167.559 kg (369 lb 6.4 oz), SpO2 96 %. No results found. No results found for this or any previous visit (from the past 72 hour(s)).   HEENT: poor dentition Cardio: RRR and no murmur Resp: CTA B/L and unlabored GI: BS positive and not tender Extremity:  No Edema Skin:   Other intact in UE and LE Neuro: Alert/Oriented to person and place, month day but not date (26th) Abnormal Sensory absent sensation Right foot, intact LT in R hand, Abnormal Motor 4- RUE and RLE, Inattention and Other Right field cut Musc/Skel:  Other no pain with UE or LE ROM Gen NAD   Assessment/Plan: 1. 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 Physiatrist is providing close team supervision and 24 hour management of active medical problems listed below. Physiatrist and rehab team continue to assess barriers to discharge/monitor patient progress toward functional and medical goals.   FIM: Function - Bathing Position: Shower Body parts bathed by patient: Right arm, Left arm, Chest, Abdomen, Front perineal area, Right upper leg, Left upper leg, Right lower leg, Left lower leg Body parts bathed by helper: Back, Buttocks Bathing not applicable: Right lower leg, Left lower leg Assist Level: Touching or steadying assistance(Pt > 75%)  Function- Upper Body Dressing/Undressing What is the patient wearing?: Pull over shirt/dress Pull over shirt/dress - Perfomed by patient: Thread/unthread right sleeve, Thread/unthread left sleeve, Put head  through opening, Pull shirt over trunk Assist Level: Supervision or verbal cues Function - Lower Body Dressing/Undressing What is the patient wearing?: Pants, Socks Position: Wheelchair/chair at sink Non-skid slipper socks- Performed by helper: Don/doff right sock, Don/doff left sock Socks - Performed by patient: Don/doff right sock, Don/doff left sock (used sockaide)  Function - Toileting Toileting steps completed by patient: Adjust clothing prior to toileting Toileting steps completed by helper: Adjust clothing prior to toileting, Performs perineal hygiene, Adjust clothing after toileting Toileting Assistive Devices: Grab bar or rail Assist level: Two helpers  Function - Archivist transfer assistive device: Elevated toilet seat/BSC over toilet Assist level to toilet: Moderate assist (Pt 50 - 74%/lift or lower) Assist level from toilet: Moderate assist (Pt 50 - 74%/lift or lower)  Function - Chair/bed transfer Chair/bed transfer assist level: Moderate assist (Pt 50 - 74%/lift or lower) Chair/bed transfer details: Verbal cues for technique, Manual facilitation for weight shifting  Function - Locomotion: Wheelchair Will patient use wheelchair at discharge?: No Type: Manual Max wheelchair distance: 75 Assist Level: Supervision or verbal cues Assist Level: Supervision or verbal cues Wheel 150 feet activity did not occur: Safety/medical concerns Turns around,maneuvers to table,bed, and toilet,negotiates 3% grade,maneuvers on rugs and over doorsills: No Function - Locomotion: Ambulation Assistive device: Walker-rolling, Orthosis (2 wide ACEs on R knee) Max distance: 75 Assist level: 2 helpers (w/c follow) Assist level: 2 helpers Walk 50 feet with 2 turns activity did not occur: Safety/medical concerns Walk 150 feet activity did not occur: Safety/medical concerns Walk 10 feet on uneven surfaces activity did not occur: Safety/medical concerns  Function -  Comprehension Comprehension: Auditory Comprehension assist level: Understands complex 90% of the time/cues 10%  of the time  Function - Expression Expression: Verbal Expression assist level: Expresses complex 90% of the time/cues < 10% of the time  Function - Social Interaction Social Interaction assist level: Interacts appropriately 90% of the time - Needs monitoring or encouragement for participation or interaction.  Function - Problem Solving Problem solving assist level: Solves basic 25 - 49% of the time - needs direction more than half the time to initiate, plan or complete simple activities  Function - Memory Memory assist level: Recognizes or recalls 25 - 49% of the time/requires cueing 50 - 75% of the time Patient normally able to recall (first 3 days only): Current season, Location of own room, That he or she is in a hospital  Medical Problem List and Plan: 1. Right side hemisensory deficits, reduced coordination and altered mental status secondary to left PCA/temporal lobe infarct- some improvement in LT sensation on RUE 2. DVT Prophylaxis/Anticoagulation: Subcutaneous Lovenox for DVT prophylaxis 3. Pain Management: Tylenol as needed 4. History of hypertension. Norvasc 5 mg daily. Monitor with increased mobility-currently controlled 136/84, CVA risk factor 5. Neuropsych: This patient is capable of making decisions on his own behalf. 6. Skin/Wound Care: Routine skin checks 7. Fluids/Electrolytes/Nutrition:0-95% meals Routine I&O normal BMET 12/27 8. Morbid obesity. Dietary consult 9. Hyperlipidemia. Lipitor- CVA risk factor 10. Tobacco abuse. Nicoderm patch. Provide counseling, CVA risk factor  LOS (Days) 4 A FACE TO FACE EVALUATION WAS PERFORMED  Leslie Jester E 05/25/2015, 7:08 AM

## 2015-05-25 NOTE — Progress Notes (Signed)
Occupational Therapy Session Note  Patient Details  Name: Bryan Schultz MRN: [id number] Date of Birth: 02/08/1956  Today's Date: 05/25/2015 OT Individual Time: 1400-1500 OT Individual Time Calculation (min): 60 min     Skilled Therapeutic Interventions/Progress Updates:    Pt propelled himself to the gym via wheelchair to start session.  Two rest breaks needed to reach the gym with pt reporting cramping in his right hand after reaching it.  Transferred stand pivot to the mat with mod assist.  Noted right knee buckling slightly with weightbearing.  Worked on integration of the RUE as well as visual scanning to the right in order to locate pieces for PVC pipe puzzle.  Pt took increased time to complete with occasional dropping of the pieces when attempting to pick them up.  Performed second puzzle while standing with min assist for static standing balance.  Pt needing mod demonstrational cueing to maintain right knee extension in standing.  Mod instructional cueing for scanning right of midline to locate all pieces needed.  Pt voicing how difficult this task is now compared to how he would have done prior to the CVA.    Therapy Documentation Precautions:  Precautions Precautions: Fall Precaution Comments: RUE and RLE weakness and sensory deficits Restrictions Weight Bearing Restrictions: No  Pain: Pain Assessment Pain Assessment: No/denies pain ADL: ADL ADL Comments: See Functional Assessment Tool  See Function Navigator for Current Functional Status.   Therapy/Group: Individual Therapy  Adriena Manfre OTR/L 05/25/2015, 4:02 PM

## 2015-05-25 NOTE — Progress Notes (Addendum)
Physical Therapy Session Note  Patient Details  Name: Bryan Schultz MRN: [id number] Date of Birth: 10/27/1955  Today's Date: 05/25/2015 PT Individual Time: 1000-1100 PT Individual Time Calculation (min): 60 min   Short Term Goals: Week 1:  PT Short Term Goal 1 (Week 1): Pt will perform functional transfers wtih min A PT Short Term Goal 2 (Week 1): Pt will gait with mod A 50' in controlled environment  Skilled Therapeutic Interventions/Progress Updates: w/c propulsion using bil UEs x 150' with supervision, cues for efficient turns.  neuromuscular re-education via forced use, VCs, manual cues for Nustep level 4 x 10 minutes bil UEs and bil LEs focusing on neutral hip alignment and full R knee ext.  Lateral step ups onto RLe on 4" high step, bil UE support on rail. Seated R/L calf raises with 5 second holds x 10.  Gait with RW, 2 ACE wraps R knee x 75' with min assist, w/c follow.  Gait up/down 8 3" high steps with bil rails, ascending step through forwards, descending step-to backwards, mod cues for sequencing, upright posture and advancement of R hand. PT returned pt to room.  Left sitting in w/c with all needs within reach, quick release belt donned.    Therapy Documentation Precautions:  Precautions Precautions: Fall Precaution Comments: RUE and RLE weakness and sensory deficits Restrictions Weight Bearing Restrictions: No   Pain: Pain Assessment Pain Assessment: No/denies pain    See Function Navigator for Current Functional Status.   Therapy/Group: Individual Therapy  Jolane Bankhead 05/25/2015, 12:21 PM
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
## 2015-05-26 ENCOUNTER — Inpatient Hospital Stay (HOSPITAL_COMMUNITY): Payer: Medicaid Other | Admitting: Physical Therapy

## 2015-05-26 ENCOUNTER — Inpatient Hospital Stay (HOSPITAL_COMMUNITY): Payer: Medicaid Other | Admitting: Speech Pathology

## 2015-05-26 ENCOUNTER — Inpatient Hospital Stay (HOSPITAL_COMMUNITY): Payer: Medicaid Other | Admitting: Occupational Therapy

## 2015-05-26 MED ORDER — TRAMADOL HCL 50 MG PO TABS
50.0000 mg | ORAL_TABLET | Freq: Once | ORAL | Status: DC
  Filled 2015-05-26 (×3): qty 1

## 2015-05-26 NOTE — Progress Notes (Signed)
Physical Therapy Session Note  Patient Details  Name: Bryan Schultz MRN: [id number] Date of Birth: 13-Nov-1955  Today's Date: 05/26/2015 PT Individual Time: 0800-0935 PT Individual Time Calculation (min): 95 min   Short Term Goals: Week 1:  PT Short Term Goal 1 (Week 1): Pt will perform functional transfers wtih min A PT Short Term Goal 2 (Week 1): Pt will gait with mod A 50' in controlled environment  Skilled Therapeutic Interventions/Progress Updates:   Pt received seated EOB eating breakfast.  Pt noted to still have TED hose donned underneath pajamas.  Pt unable to recall if he slept in them; questioned nurse tech who reports pt did sleep in them.  Advised nurse tech to remove TED hose tonight and educated pt on need to wear TED hose during day when OOB only and to remove at night.  Removed TED hose secondary to pt reporting they are too tight and increased edema noted.  Pt also noted to have had large incontinent urine episode overflowing brief and soaking clothing and all bed linens down to mattress.  When questioned pt aware of incontinent episode but had not called for assistance to clean or change clothing.  Pt reports he can sense the urge to urinate and can wait until help arrives before voiding but had not called for assistance to urinate this am.  Assisted pt with removal of soiled clothes and bed linens and pt performed multiple sit > stands from bed and performed static standing with UE support on RW while therapist performed hygiene of back side and pt performed hygiene of front perineal area with min A for balance.  Returned to sitting and assisted with donning clean brief, shirt and boxer briefs-pt without clean pants so gown donned as well for extra coverage.  Pt transferred to w/c stand pivot mod A with therapist stabilizing R knee in stance and providing verbal cues for safe pivoting sequence.  Placed clothing in washer and cued pt to remind therapist at 9:20 am to return to put clothes  in dryer (33 min later); pt repeated time.  In gym pt performed transfer to mat stand pivot mod A.  Performed bilat hip flexor stretches in supine with each LE off side of mat.  Returned to sitting and performed NMR: see below.  At 0910 pt questioned about what he needed to remember to do at 0920.  Pt unable to recall task and required total A to recall clothing in washer.  Returned to laundry room and pt performed standing at washer and dryer to engage in use of RUE to remove clothes from washer > dryer to R and use of visual scanning and RUE to set dryer.  Pt returned to room and transferred to recliner stand pivot min-mod A with RW.  Pt left with LE elevated for edema management, quick release in place and all items within reach.  Pt continues to demonstrate extremely impaired short term memory, awareness, and problem solving.      Therapy Documentation Precautions:  Precautions Precautions: Fall Precaution Comments: RUE and RLE weakness and sensory deficits Restrictions Weight Bearing Restrictions: No Pain: Pain Assessment Pain Assessment: 0-10 Pain Score: 1  Pain Type: Acute pain Pain Location: Back Pain Orientation: Lower Pain Descriptors / Indicators: Aching Pain Onset: Gradual Pain Intervention(s): Medication (See eMAR) Other Treatments: Treatments Neuromuscular Facilitation: Right;Lower Extremity;Forced use;Activity to increase motor control;Activity to increase timing and sequencing;Activity to increase sustained activation;Activity to increase lateral weight shifting;Activity to increase anterior-posterior weight shifting in standing with  UE support on RW during resisted closed chain terminal R knee and hip extensions x 10 reps + maintaining terminal hip/knee extension and proximal hip stability during L hip flexion/marches x 10 reps.  Transitioned to sit <> stand from mat without use of UE with L foot wedged to increase weight shift and activation of RLE with mod A x 10 reps.      See Function Navigator for Current Functional Status.   Therapy/Group: Individual Therapy  Edman CircleHall, Sharece Fleischhacker [facility] 05/26/2015, 12:14 PM
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
## 2015-05-26 NOTE — Progress Notes (Signed)
Occupational Therapy Session Note  Patient Details  Name: Bryan Schultz MRN: [id number] Date of Birth: 11/03/1955  Today's Date: 05/26/2015 OT Individual Time: 1100-1200 OT Individual Time Calculation (min): 60 min    Short Term Goals: Week 1:  OT Short Term Goal 1 (Week 1): Pt will complete tub bench transfer in tub/shower with mod assist OT Short Term Goal 2 (Week 1): Pt will complete bathing seated using AE to reach to feet and back with min assist OT Short Term Goal 3 (Week 1): Pt will demo ability to complete 3 of 4 grooming tasks using right hand as dominant with supervision OT Short Term Goal 4 (Week 1): Pt will complete toileting with mod assist for thoroughness OT Short Term Goal 5 (Week 1): Pt will dress lower body using AE with mod assist.  Skilled Therapeutic Interventions/Progress Updates:    Pt seen for OT session focusing on ADL re-training and neuro re-ed with attention to R. Pt sitting in recliner upon arrival, agreeable to tx session. He transferred to w/c with min- mod A stand pivot with RW> he bathed seated in w/c at sink, declining showering task and full bathing wishing to just wash periarea/ buttock. He stood at sink with steadying assist to complete standing toileting task with hand held urinal and hygiene. VCs for locking of w/c breaks required throughout session, with little sign of carry over of education.  Pt self propelled w/c to therapy gym using B UEs and LEs. Pt with decreased awareness to R side, running into wall with R side of w/c. In gym, pt stood at RW to complete card matching task in standing with emphasis on scanning to R and functional standing balance. Pt tolerated x2 trials of ~5 minutes standing to reach to R side to obtain card and place on board in front. Pt required significantly increased time to match cards placed R of midline. Max VCs for scanning and lighthouse technique. Pt returned to room at end of session, left sitting in w/c with all needs in  reach, QRB donned.   Therapy Documentation Precautions:  Precautions Precautions: Fall Precaution Comments: RUE and RLE weakness and sensory deficits Restrictions Weight Bearing Restrictions: No Pain: Pain Assessment Pain Assessment: 0-10 Pain Score: 1  Pain Type: Acute pain Pain Location: Back Pain Orientation: Lower Pain Descriptors / Indicators: Aching Pain Onset: Gradual Pain Intervention(s): Repositioned ADL: ADL ADL Comments: See Functional Assessment Tool  See Function Navigator for Current Functional Status.   Therapy/Group: Individual Therapy  [name], [name] 05/26/2015, 6:54 AM

## 2015-05-26 NOTE — Progress Notes (Signed)
Subjective/Complaints: Patient seen this morning resting in bed. He is jovial and asked if he would like something states "I need it all".     ROS- denies SOB, CP or sleep disturbance  Objective: Vital Signs: Blood pressure 119/40, pulse 80, temperature 98.1 F (36.7 C), temperature source Oral, resp. rate 18, height  (1.854 m), weight 167.559 kg (369 lb 6.4 oz), SpO2 94 %. No results found. No results found for this or any previous visit (from the past 72 hour(s)).  Gen NAD. Vital signs reviewed HEENT: poor dentition. Normocephalic, atraumatic Cardio: RRR and no murmur Resp: CTA B/L and unlabored GI: BS positive and not tender Musc/Skel:  No tenderness. No Edema Neuro: Alert and Oriented  Motor 4- RUE and RLE proximal to distal Skin: Warm and dry    Assessment/Plan: 1. 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 Physiatrist is providing close team supervision and 24 hour management of active medical problems listed below. Physiatrist and rehab team continue to assess barriers to discharge/monitor patient progress toward functional and medical goals.   FIM: Function - Bathing Position: Shower Body parts bathed by patient: Right arm, Left arm, Chest, Abdomen, Front perineal area, Right upper leg, Left upper leg, Right lower leg, Left lower leg Body parts bathed by helper: Back, Buttocks Bathing not applicable: Buttocks, Back Assist Level: Touching or steadying assistance(Pt > 75%)  Function- Upper Body Dressing/Undressing What is the patient wearing?: Pull over shirt/dress Pull over shirt/dress - Perfomed by patient: Thread/unthread right sleeve, Thread/unthread left sleeve, Put head through opening, Pull shirt over trunk Assist Level:  Supervision or verbal cues Function - Lower Body Dressing/Undressing What is the patient wearing?: Pants, Socks Position: Wheelchair/chair at sink Pants- Performed by patient: Thread/unthread right pants leg, Thread/unthread left pants leg Pants- Performed by helper: Pull pants up/down Non-skid slipper socks- Performed by helper: Don/doff right sock, Don/doff left sock Socks - Performed by patient: Don/doff right sock, Don/doff left sock (used sockaide) Socks - Performed by helper: Don/doff right sock, Don/doff left sock  Function - Toileting Toileting steps completed by patient: Adjust clothing prior to toileting Toileting steps completed by helper: Adjust clothing prior to toileting, Performs perineal hygiene, Adjust clothing after toileting Toileting Assistive Devices: Grab bar or rail Assist level: Two helpers  Function - Archivist transfer assistive device: Elevated toilet seat/BSC over toilet Assist level to toilet: Moderate assist (Pt 50 - 74%/lift or lower) Assist level from toilet: Moderate assist (Pt 50 - 74%/lift or lower)  Function - Chair/bed transfer Chair/bed transfer assist level: Moderate assist (Pt 50 - 74%/lift or lower) Chair/bed transfer assistive device: Walker Chair/bed transfer details: Verbal cues for technique, Manual facilitation for weight shifting, Verbal cues for safe use of DME/AE  Function - Locomotion: Wheelchair Will patient use wheelchair at discharge?: No Type: Manual Max wheelchair distance: 150 Assist Level: Supervision or verbal cues Assist Level: Supervision or verbal cues Wheel 150 feet activity did not occur: Safety/medical concerns Assist Level: Supervision or verbal cues Turns around,maneuvers to table,bed, and toilet,negotiates 3% grade,maneuvers on rugs and over doorsills: No Function - Locomotion: Ambulation Assistive device: Walker-rolling, Orthosis Max distance: 75 Assist level: 2 helpers Assist level: 2  helpers Walk 50 feet with 2 turns activity did not occur: Safety/medical concerns Walk 150 feet activity did not occur: Safety/medical concerns Walk 10 feet  on uneven surfaces activity did not occur: Safety/medical concerns  Function - Comprehension Comprehension: Auditory Comprehension assist level: Understands complex 90% of the time/cues 10% of the time  Function - Expression Expression: Verbal Expression assist level: Expresses complex 90% of the time/cues < 10% of the time  Function - Social Interaction Social Interaction assist level: Interacts appropriately 90% of the time - Needs monitoring or encouragement for participation or interaction.  Function - Problem Solving Problem solving assist level: Solves basic 25 - 49% of the time - needs direction more than half the time to initiate, plan or complete simple activities  Function - Memory Memory assist level: Recognizes or recalls 25 - 49% of the time/requires cueing 50 - 75% of the time Patient normally able to recall (first 3 days only): Current season, Location of own room, That he or she is in a hospital  Medical Problem List and Plan: 1. Right side hemisensory deficits, reduced coordination and altered mental status secondary to left PCA/temporal lobe infarct- some improvement in LT sensation on RUE  Continue CIR 2. DVT Prophylaxis/Anticoagulation: Subcutaneous Lovenox for DVT prophylaxis 3. Pain Management: Tylenol as needed 4. History of hypertension. Norvasc 5 mg daily. Monitor with increased mobility-currently controlled 119/40, CVA risk factor 5. Neuropsych: This patient is capable of making decisions on his own behalf. 6. Skin/Wound Care: Routine skin checks 7. Fluids/Electrolytes/Nutrition: 75-100% meals for the most part. Routine I&O normal BMET 12/27 8. Morbid obesity. Dietary consult 9. Hyperlipidemia. Lipitor- CVA risk factor 10. Tobacco abuse. Nicoderm patch. Provide counseling, CVA risk factor  LOS  (Days) 5 A FACE TO FACE EVALUATION WAS PERFORMED  Prynce Jacober Karis Jubanil Itati Brocksmith 05/26/2015, 7:33 AM

## 2015-05-27 ENCOUNTER — Inpatient Hospital Stay (HOSPITAL_COMMUNITY): Payer: Medicaid Other | Admitting: Physical Therapy

## 2015-05-27 LAB — GLUCOSE, CAPILLARY: Glucose-Capillary: 129 mg/dL — ABNORMAL HIGH (ref 65–99)

## 2015-05-27 NOTE — Progress Notes (Signed)
Subjective/Complaints: She continues having up in bed eating breakfast. He is in good spirits and comments about the quality of the food in the hospital. He had a headache yesterday but that seems to have worn off.  ROS- denies SOB, n/v, CP or sleep disturbance  Objective: Vital Signs: Blood pressure 150/98, pulse 85, temperature 97.9 F (36.6 C), temperature source Oral, resp. rate 20, height 6\' 1"  (1.854 m), weight 167.559 kg (369 lb 6.4 oz), SpO2 95 %. No results found. No results found for this or any previous visit (from the past 72 hour(s)).  Gen NAD. Vital signs reviewed HEENT: poor dentition. Normocephalic, atraumatic Cardio: RRR and no murmur Resp: CTA B/L and unlabored GI: BS positive and not tender Musc/Skel:  No tenderness. No Edema Neuro: Alert and Oriented  Motor 4- RUE and RLE proximal to distal Skin: Warm and dry   Assessment/Plan: 1. 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 Physiatrist is providing close team supervision and 24 hour management of active medical problems listed below. Physiatrist and rehab team continue to assess barriers to discharge/monitor patient progress toward functional and medical goals.   FIM: Function - Bathing Position: Wheelchair/chair at sink Body parts bathed by patient: Front perineal area, Buttocks Body parts bathed by helper: Back, Buttocks Bathing not applicable: Right arm, Right upper leg, Left arm, Left upper leg, Chest, Abdomen, Right lower leg, Left lower leg, Back Assist Level: Touching or steadying assistance(Pt > 75%)  Function- Upper Body Dressing/Undressing What is the patient wearing?: Pull over shirt/dress Pull over shirt/dress - Perfomed by patient: Thread/unthread right sleeve,  Thread/unthread left sleeve, Put head through opening, Pull shirt over trunk Assist Level: Supervision or verbal cues Function - Lower Body Dressing/Undressing What is the patient wearing?: Pants, Socks Position: Wheelchair/chair at sink Pants- Performed by patient: Thread/unthread right pants leg, Thread/unthread left pants leg Pants- Performed by helper: Pull pants up/down Non-skid slipper socks- Performed by helper: Don/doff right sock, Don/doff left sock Socks - Performed by patient: Don/doff right sock, Don/doff left sock (used sockaide) Socks - Performed by helper: Don/doff right sock, Don/doff left sock  Function - Toileting Toileting activity did not occur: No continent bowel/bladder event Toileting steps completed by patient: Adjust clothing prior to toileting Toileting steps completed by helper: Adjust clothing prior to toileting, Performs perineal hygiene, Adjust clothing after toileting Toileting Assistive Devices: Grab bar or rail Assist level: Two helpers  Function - Archivist transfer assistive device: Elevated toilet seat/BSC over toilet Assist level to toilet: Moderate assist (Pt 50 - 74%/lift or lower) Assist level from toilet: Moderate assist (Pt 50 - 74%/lift or lower)  Function - Chair/bed transfer Chair/bed transfer assist level: Moderate assist (Pt 50 - 74%/lift or lower) Chair/bed transfer assistive device: Walker Chair/bed transfer details: Verbal cues for technique, Manual facilitation for weight shifting, Verbal cues for safe use of DME/AE  Function - Locomotion: Wheelchair Will patient use wheelchair at discharge?: No Type: Manual Max wheelchair distance: 150 Assist Level: Supervision or verbal cues Assist Level: Supervision or verbal cues Wheel 150 feet activity did not occur: Safety/medical concerns Assist Level: Supervision or verbal cues Turns around,maneuvers to table,bed, and toilet,negotiates 3% grade,maneuvers on rugs and over  doorsills: No Function - Locomotion: Ambulation Assistive device: Walker-rolling, Orthosis Max distance: 25 Assist level: Moderate assist (Pt 50 - 74%) Assist level:  Moderate assist (Pt 50 - 74%) Walk 50 feet with 2 turns activity did not occur: Safety/medical concerns Walk 150 feet activity did not occur: Safety/medical concerns Walk 10 feet on uneven surfaces activity did not occur: Safety/medical concerns  Function - Comprehension Comprehension: Auditory Comprehension assist level: Understands complex 90% of the time/cues 10% of the time  Function - Expression Expression: Verbal Expression assist level: Expresses complex 90% of the time/cues < 10% of the time  Function - Social Interaction Social Interaction assist level: Interacts appropriately 90% of the time - Needs monitoring or encouragement for participation or interaction.  Function - Problem Solving Problem solving assist level: Solves basic 25 - 49% of the time - needs direction more than half the time to initiate, plan or complete simple activities  Function - Memory Memory assist level: Recognizes or recalls 25 - 49% of the time/requires cueing 50 - 75% of the time Patient normally able to recall (first 3 days only): Current season, Location of own room, That he or she is in a hospital  Medical Problem List and Plan: 1. Right side hemisensory deficits, reduced coordination and altered mental status secondary to left PCA/temporal lobe infarct- some improvement in LT sensation on RUE  Continue CIR 2. DVT Prophylaxis/Anticoagulation: Subcutaneous Lovenox for DVT prophylaxis 3. Pain Management: Tylenol as needed 4. History of hypertension. Norvasc 5 mg daily.   Monitor with increased mobility- 150/49, CVA risk factor  Will continue to monitor and consider increase in medication if BPs remains persistently elevated 5. Neuropsych: This patient is capable of making decisions on his own behalf. 6. Skin/Wound Care: Routine  skin checks 7. Fluids/Electrolytes/Nutrition: 25-100% meals for the most part. Routine I&O normal BMET 12/27 8. Morbid obesity. Dietary consult 9. Hyperlipidemia. Lipitor- CVA risk factor 10. Tobacco abuse. Nicoderm patch. Provide counseling, CVA risk factor  LOS (Days) 6 A FACE TO FACE EVALUATION WAS PERFORMED  Daneille Desilva Karis Jubanil Corinthian Kemler 05/27/2015, 8:50 AM

## 2015-05-27 NOTE — Progress Notes (Signed)
Physical Therapy Session Note  Patient Details  Name: Bryan Schultz MRN: [id number] Date of Birth: 01/19/1956  Today's Date: 05/27/2015 PT Individual Time: 0930-1015 PT Individual Time Calculation (min): 45 min   Short Term Goals: Week 1:  PT Short Term Goal 1 (Week 1): Pt will perform functional transfers wtih min A PT Short Term Goal 2 (Week 1): Pt will gait with mod A 50' in controlled environment  Skilled Therapeutic Interventions/Progress Updates:   Pt with difficulty sequencing multi-unit commands and tasks benefiting from cues to break task down. Pt able to active R lateral L/S flexors in sitting with cueing, but difficulty in standing even with cues. Pt would continue to benefit from skilled PT services to increase functional mobility.  Therapy Documentation Precautions:  Precautions Precautions: Fall Precaution Comments: RUE and RLE weakness and sensory deficits Restrictions Weight Bearing Restrictions: No Vital Signs: Therapy Vitals BP: (!) 150/98 mmHg Pain: Pain Assessment Pain Assessment: 0-10 Pain Score: 7  Pain Type: Chronic pain Pain Location: Head (and back) Pain Descriptors / Indicators: Aching Pain Onset: On-going Pain Intervention(s): Refused Mobility:  Mod A with cues for weight shift and transfers Other Treatments:  Pt educated on rehab plan, forced use, safety in mobility, R sided attention rehab prognosis, weight shift, and core control. Pt performs RUE forced use with cell phone with cues and breaking down task into more manageable parts. Pt performs transfers 2x10. Pt performs static standing 1'x4. Pt performs BLE advancement pre-gait 2x10. Pt performs heel raises 2x10. Transfers performed x10 in session.   See Function Navigator for Current Functional Status.   Therapy/Group: Individual Therapy  Christia ReadingKinney, Atlas Crossland G 05/27/2015, 9:59 AM

## 2015-05-28 ENCOUNTER — Inpatient Hospital Stay (HOSPITAL_COMMUNITY): Payer: Medicaid Other | Admitting: Speech Pathology

## 2015-05-28 ENCOUNTER — Inpatient Hospital Stay (HOSPITAL_COMMUNITY): Payer: Medicaid Other

## 2015-05-28 ENCOUNTER — Inpatient Hospital Stay (HOSPITAL_COMMUNITY): Payer: Medicaid Other | Admitting: Occupational Therapy

## 2015-05-28 LAB — CREATININE, SERUM
Creatinine, Ser: 1.16 mg/dL (ref 0.61–1.24)
GFR calc Af Amer: >60 mL/min (ref >60)
GFR calc non Af Amer: >60 mL/min (ref >60)

## 2015-05-28 NOTE — Progress Notes (Signed)
Physical Therapy Session Note  Patient Details  Name: Bryan Schultz MRN: [id number] Date of Birth: 01/22/1956  Today's Date: 05/28/2015 PT Individual Time: 1300-1400 PT Individual Time Calculation (min): 60 min   Short Term Goals: Week 1:  PT Short Term Goal 1 (Week 1): Pt will perform functional transfers wtih min A PT Short Term Goal 2 (Week 1): Pt will gait with mod A 50' in controlled environment  Skilled Therapeutic Interventions/Progress Updates:   Session focused on functional strengthening and endurance (w/c mobility with UE's down to therapy gym and seated LAQ with 5 second hold x 10 reps each during rest break), neuro re-ed to RLE for forced use, coordination, motor control, and strengthening, and gait training with RW with focus on posture, step length, and stance on RLE.   Neuro re-ed activities to include sit to stands without UE support x 5 reps, then bias to R side with 2" block under L foot for sit <> stands without UE support x 10 reps with min assist, single limb balance for toe taps and then alternating toe taps to 4" step progressing to step ups each x 10 reps each LE, and functional reaching/perception task (while L foot under 2" step) to reach for horseshoes and increase weightbearing to R side (min assist but mod assist needed as fatiguing).   Pt able to gait today x 150' with RW with steady A and verbal cues for attention to R, safe placement of RW, and adjusting step length for improved gait pattern. Pt very pleased with progress during session. Pt continues to demonstrate decreased carryover and impaired memory during session.   Therapy Documentation Precautions:  Precautions Precautions: Fall Precaution Comments: RUE and RLE weakness and sensory deficits Restrictions Weight Bearing Restrictions: No  Pain: Pain Assessment Pain Assessment: No/denies pain   See Function Navigator for Current Functional Status.   Therapy/Group: Individual Therapy  Karolee StampsGray, Pamlea Finder  Darrol PokeBrescia  Tamiki Kuba B. Ruthvik Barnaby, PT, DPT  05/28/2015, 2:17 PM

## 2015-05-28 NOTE — Progress Notes (Signed)
Occupational Therapy Session Note  Patient Details  Name: Bryan Schultz MRN: [id number] Date of Birth: 09/22/1955  Today's Date: 05/28/2015 OT Individual Time: 1431-1531 OT Individual Time Calculation (min): 60 min    Skilled Therapeutic Interventions/Progress Updates:    Pt propelled his wheelchair down to the therapy gym with supervision for RUE neuromuscular re-education.  Completed 9 mins on the UE ergonometer with resistance set on level 10.  First set for 3 mins using both UEs and 2nd and 3rd sets for 3 mins using just the RUE.  He was able to maintain RPMs at 25-27 per minute for each set.  Transitioned to Fm coordination activities.  Had him complete 9 hole peg test with both the L and R hands.  He was able to complete in 35 seconds on the left but could not complete 1 peg when using the RUE.  Had pt work on picking up larger 1 inch pegs using the RUE, incorporating scanning to the right as well.  He was only able to pick up the ones with coban on the ends and then still only managed to complete these successfully for 50% of trials.  Therapist issued small foam pieces to work on picking up one at a time in his room.  Pt taken back to room with safety belt put in place and call button within reach.    Therapy Documentation Precautions:  Precautions Precautions: Fall Precaution Comments: RUE and RLE weakness and sensory deficits Restrictions Weight Bearing Restrictions: No  Pain: Pain Assessment Pain Assessment: No/denies pain ADL: ADL ADL Comments: See Functional Assessment Tool  See Function Navigator for Current Functional Status.   Therapy/Group: Individual Therapy  Rodina Pinales OTR/L 05/28/2015, 3:48 PM

## 2015-05-28 NOTE — Progress Notes (Signed)
Subjective/Complaints: She continues having up in bed eating breakfast. He is in good spirits and comments about the quality of the food in the hospital. He had a headache yesterday but that seems to have worn off.  ROS- denies SOB, n/v, CP or sleep disturbance  Objective: Vital Signs: Blood pressure 128/81, pulse 80, temperature 97.6 F (36.4 C), temperature source Oral, resp. rate 19, height _0  (1.854 m), weight 167.559 kg (369 lb 6.4 oz), SpO2 97 %. No results found. Results for orders placed or performed during the hospital encounter of 05/21/15 (from the past 72 hour(s))  Glucose, capillary     Status: Abnormal   Collection Time: 05/27/15 11:23 AM  Result Value Ref Range   Glucose-Capillary 129 (H) 65 - 99 mg/dL   Comment 1 Notify RN   Creatinine, serum     Status: None   Collection Time: 05/28/15  7:50 AM  Result Value Ref Range   Creatinine, Ser 1.16 0.61 - 1.24 mg/dL   GFR calc non Af Amer >60 >60 mL/min   GFR calc Af Amer >60 >60 mL/min    Comment: (NOTE) The eGFR has been calculated using the CKD EPI equation. This calculation has not been validated in all clinical situations. eGFR's persistently <60 mL/min signify possible Chronic Kidney Disease.     Gen NAD. Vital signs reviewed HEENT: poor dentition. Normocephalic, atraumatic Cardio: RRR and no murmur Resp: CTA B/L and unlabored GI: BS positive and not tender Musc/Skel:  No tenderness. No Edema Neuro: Alert and Oriented  Motor 4- RUE and RLE proximal to distal Skin: Warm and dry   Assessment/Plan: 1. 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 Physiatrist is providing close team supervision and 24 hour management of active medical problems listed below. Physiatrist and rehab team  continue to assess barriers to discharge/monitor patient progress toward functional and medical goals.   FIM: Function - Bathing Position: Wheelchair/chair at sink Body parts bathed by patient: Front perineal area, Buttocks Body parts bathed by helper: Back, Buttocks Bathing not applicable: Right arm, Right upper leg, Left arm, Left upper leg, Chest, Abdomen, Right lower leg, Left lower leg, Back Assist Level: Touching or steadying assistance(Pt > 75%)  Function- Upper Body Dressing/Undressing What is the patient wearing?: Pull over shirt/dress Pull over shirt/dress - Perfomed by patient: Thread/unthread right sleeve, Thread/unthread left sleeve, Put head through opening, Pull shirt over trunk Assist Level: Supervision or verbal cues Function - Lower Body Dressing/Undressing What is the patient wearing?: Pants, Socks Position: Wheelchair/chair at sink Pants- Performed by patient: Thread/unthread right pants leg, Thread/unthread left pants leg Pants- Performed by helper: Pull pants up/down Non-skid slipper socks- Performed by helper: Don/doff right sock, Don/doff left sock Socks - Performed by patient: Don/doff right sock, Don/doff left sock (used sockaide) Socks - Performed by helper: Don/doff right sock, Don/doff left sock  Function - Toileting Toileting activity did not occur: No continent bowel/bladder event Toileting steps completed by patient: Adjust clothing prior to toileting Toileting steps completed by helper: Adjust clothing prior to toileting, Performs perineal hygiene, Adjust clothing after toileting Toileting Assistive Devices: Grab bar or rail Assist level: Two helpers  Function - Air cabin crew transfer assistive device: Elevated toilet seat/BSC over toilet Assist level to toilet: Moderate assist (Pt 50 - 74%/lift or lower) Assist level from toilet: Moderate assist (Pt  50 - 74%/lift or lower)  Function - Chair/bed transfer Chair/bed transfer assist level:  Moderate assist (Pt 50 - 74%/lift or lower) Chair/bed transfer assistive device: Walker Chair/bed transfer details: Verbal cues for technique, Manual facilitation for weight shifting, Verbal cues for safe use of DME/AE  Function - Locomotion: Wheelchair Will patient use wheelchair at discharge?: No Type: Manual Max wheelchair distance: 150 Assist Level: Supervision or verbal cues Assist Level: Supervision or verbal cues Wheel 150 feet activity did not occur: Safety/medical concerns Assist Level: Supervision or verbal cues Turns around,maneuvers to table,bed, and toilet,negotiates 3% grade,maneuvers on rugs and over doorsills: No Function - Locomotion: Ambulation Assistive device: Walker-rolling, Orthosis Max distance: 25 Assist level: Moderate assist (Pt 50 - 74%) Assist level: Moderate assist (Pt 50 - 74%) Walk 50 feet with 2 turns activity did not occur: Safety/medical concerns Walk 150 feet activity did not occur: Safety/medical concerns Walk 10 feet on uneven surfaces activity did not occur: Safety/medical concerns  Function - Comprehension Comprehension: Auditory Comprehension assist level: Understands complex 90% of the time/cues 10% of the time  Function - Expression Expression: Verbal Expression assist level: Expresses complex 90% of the time/cues < 10% of the time  Function - Social Interaction Social Interaction assist level: Interacts appropriately 90% of the time - Needs monitoring or encouragement for participation or interaction.  Function - Problem Solving Problem solving assist level: Solves basic 25 - 49% of the time - needs direction more than half the time to initiate, plan or complete simple activities  Function - Memory Memory assist level: Recognizes or recalls 25 - 49% of the time/requires cueing 50 - 75% of the time Patient normally able to recall (first 3 days only): Current season, Location of own room, That he or she is in a hospital  Medical Problem  List and Plan: 1. Right side hemisensory deficits, reduced coordination and altered mental status secondary to left PCA/temporal lobe infarct- still with dense field cut        2. DVT Prophylaxis/Anticoagulation: Subcutaneous Lovenox for DVT prophylaxis 3. Pain Management: Tylenol as needed 4. History of hypertension. Norvasc 5 mg daily.   Monitor with increased mobility-128/81 but had elevated am readings on 1/1 , CVA risk factor  Will continue to monitor  5. Neuropsych: This patient is capable of making decisions on his own behalf. 6. Skin/Wound Care: Routine skin checks 7. Fluids/Electrolytes/Nutrition: 50-100% meals for the most part. Routine I&O normal BMET 12/27 8. Morbid obesity. Dietary consult 9. Hyperlipidemia. Lipitor- CVA risk factor 10. Tobacco abuse. Nicoderm patch. Provide counseling, CVA risk factor  LOS (Days) 7 A FACE TO FACE EVALUATION WAS PERFORMED  KIRSTEINS,ANDREW E 05/28/2015, 8:30 AM

## 2015-05-28 NOTE — Progress Notes (Signed)
Occupational Therapy Session Note  Patient Details  Name: Bryan Schultz MRN: [id number] Date of Birth: 09/10/1955  Today's Date: 05/28/2015 OT Individual Time: 1005-1103 OT Individual Time Calculation (min): 58 min    Short Term Goals: Week 1:  OT Short Term Goal 1 (Week 1): Pt will complete tub bench transfer in tub/shower with mod assist OT Short Term Goal 2 (Week 1): Pt will complete bathing seated using AE to reach to feet and back with min assist OT Short Term Goal 3 (Week 1): Pt will demo ability to complete 3 of 4 grooming tasks using right hand as dominant with supervision OT Short Term Goal 4 (Week 1): Pt will complete toileting with mod assist for thoroughness OT Short Term Goal 5 (Week 1): Pt will dress lower body using AE with mod assist.  Skilled Therapeutic Interventions/Progress Updates:    Pt completed toileting, shower, and dressing during session.  He was able to ambulate to the toilet with min assist using the RW.  He completed toileting with max assist overall.  Decreased efficiency for reaching his peri area with toilet hygiene and during bathing tasks.  Encouraged scanning to the right for location of grooming items, shower controls, and clothing.  Pt noted bumping into the door opening on the right side with the RW when attempting to ambulate into the bathroom.  AE used for LB dressing with min assist for manipulation and threading the right pants leg.  Min assist for all sit to stand transitions with bathing and dressing tasks.  Pt left in wheelchair at the sink to complete grooming items.    Therapy Documentation Precautions:  Precautions Precautions: Fall Precaution Comments: RUE and RLE weakness and sensory deficits Restrictions Weight Bearing Restrictions: No  Pain: Pain Assessment Pain Assessment: No/denies pain ADL: See Function Navigator for Current Functional Status.   Therapy/Group: Individual Therapy  Clerence Gubser OTR/L 05/28/2015, 12:06 PM

## 2015-05-28 NOTE — Evaluation (Signed)
Speech Language Pathology Bedside Swallow Assessment  and Daily Session Note  Patient Details  Name: Bryan Schultz MRN: [id number] Date of Birth: [birth year]    Today's Date: 05/28/2015 SLP Individual Time: 0832-0900 SLP Individual Time Calculation (min): 28 min   Problem List:  Patient Active Problem List   Diagnosis Date Noted  . 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 Essential hypertension   . Morbid obesity ([place])   . HLD (hyperlipidemia)   . Tobacco abuse   . Dysmetria   . Ataxia, late effect of cerebrovascular disease   . CVA (cerebral vascular accident) ([place]) 05/19/2015  . Acute CVA (cerebrovascular accident) ([place]) 05/18/2015   Past Medical History:  Past Medical History  Diagnosis Date  . Hypertension   . Myocardial infarction [facility])    Past Surgical History: No past surgical history on file.  Assessment / Plan / Recommendation Clinical Impression Bedside swallow evaluation complete.  Despite missing dentition and cognitive deficits (see SLE for details) patient was Mod I for self-feeding regular textures and thin liquids after set-up assist.  Patient demonstrated no overt s/s of aspiration and with extra time orally masticated and cleared crunchy solids.  Patient reports a history of smoking which may contribute to an intermittent cough that he report; however, not likely related to his CVA.  Therefore, no skilled SLP services are warranted for swallow function at this time.        Skilled Therapeutic Interventions          Skilled treatment session focused on addressing cognition goals. SLP facilitated session by providing Mod assist verbal and visual cues during PO intake to attend yo right upper extremity and environment.  Additionally, patient required Mod question cues to verbally identify 2 cognitive deficits resulting from his CVA.  Continue with current  plan of care addressing cognition.       Recommendations  Oral Care Recommendations: Oral care BID            Pain Pain Assessment Pain Assessment: No/denies pain  Function:  Eating Eating   Modified Consistency Diet: No Eating Assist Level: Set up assist for   Eating Set Up Assist For: Opening containers       Cognition Comprehension Comprehension assist level: Understands complex 90% of the time/cues 10% of the time  Expression   Expression assist level: Expresses complex 90% of the time/cues < 10% of the time  Social Interaction Social Interaction assist level: Interacts appropriately 90% of the time - Needs monitoring or encouragement for participation or interaction.  Problem Solving Problem solving assist level: Solves basic 50 - 74% of the time/requires cueing 25 - 49% of the time  Memory Memory assist level: Recognizes or recalls 25 - 49% of the time/requires cueing 50 - 75% of the time   Short Term Goals: Week 1: SLP Short Term Goal 1 (Week 1): Patient will demonstrate problem solving for functional and familiar tasks with Min A verbal and question cues.  SLP Short Term Goal 2 (Week 1): Patient will recall new, daily information with use of memory compensatory strategies with Mod A verbal and question cues.  SLP Short Term Goal 3 (Week 1): Patient will demonstrate sustained attention to a functional task for 30 minutes with supervision verbal cues for redirection.  SLP Short Term Goal 4 (Week 1): Patient will identify 2 cognitive deficits with supervision verbal cues.  SLP Short  Term Goal 5 (Week 1): Patient will attend to right field of enviornment during functional tasks with Mod A verbal and visual cues.   Refer to Care Plan for Long Term Goals  Recommendations for other services: None  Discharge Criteria: Patient will be discharged from SLP if patient refuses treatment 3 consecutive times without medical reason, if treatment goals not met, if there is a change  in medical status, if patient makes no progress towards goals or if patient is discharged from hospital.  The above assessment, treatment plan, treatment alternatives and goals were discussed and mutually agreed upon: by patient  Gunnar Fusi, M.A., CCC-SLP [phone]  [place] 05/28/2015, 12:32 PM

## 2015-05-29 ENCOUNTER — Inpatient Hospital Stay (HOSPITAL_COMMUNITY): Payer: Medicaid Other | Admitting: Physical Therapy

## 2015-05-29 ENCOUNTER — Inpatient Hospital Stay (HOSPITAL_COMMUNITY): Payer: Medicaid Other

## 2015-05-29 ENCOUNTER — Inpatient Hospital Stay (HOSPITAL_COMMUNITY): Payer: Medicaid Other | Admitting: Occupational Therapy

## 2015-05-29 ENCOUNTER — Inpatient Hospital Stay (HOSPITAL_COMMUNITY): Payer: Medicaid Other | Admitting: Speech Pathology

## 2015-05-29 NOTE — Progress Notes (Signed)
Social Work Patient ID: Bryan CelesteJohn Schultz, male   DOB: 04/16/1956, 60 y.o.   MRN: [id number] Phone call from pt's friend-April who has not gotten permission form her landlord to have pt come stay with her. She wanted to discuss the options available to him. If no one can take him in or He can go home alone then his only options is NHP. She does not want this and will work on alternatives for both of them. By the end of the conversation it was left she would take him to her apartment even if she did not have her landlords Permission. Unsure the reason she contacted this worker, guess to problem solve what she would do. Encouraged her to discuss with pt and see what works best for the both of them. He would not want her to get evicted due to him. Will contact her tomorrow after team conference regarding the discharge plan.

## 2015-05-29 NOTE — Progress Notes (Signed)
Subjective/Complaints: Slept ok, no problems in therapy R side still feeling numb  ROS- denies SOB, n/v, CP or sleep disturbance  Objective: Vital Signs: Blood pressure 121/92, pulse 83, temperature 98.9 F (37.2 C), temperature source Oral, resp. rate 18, height '6\' 1"'$  (1.854 m), weight 167.559 kg (369 lb 6.4 oz), SpO2 94 %. No results found. Results for orders placed or performed during the hospital encounter of 05/21/15 (from the past 72 hour(s))  Glucose, capillary     Status: Abnormal   Collection Time: 05/27/15 11:23 AM  Result Value Ref Range   Glucose-Capillary 129 (H) 65 - 99 mg/dL   Comment 1 Notify RN   Creatinine, serum     Status: None   Collection Time: 05/28/15  7:50 AM  Result Value Ref Range   Creatinine, Ser 1.16 0.61 - 1.24 mg/dL   GFR calc non Af Amer >60 >60 mL/min   GFR calc Af Amer >60 >60 mL/min    Comment: (NOTE) The eGFR has been calculated using the CKD EPI equation. This calculation has not been validated in all clinical situations. eGFR's persistently <60 mL/min signify possible Chronic Kidney Disease.     Gen NAD. Vital signs reviewed HEENT: poor dentition. Normocephalic, atraumatic Cardio: RRR and no murmur Resp: CTA B/L and unlabored GI: BS positive and not tender Musc/Skel:  No tenderness. No Edema Neuro: Alert and Oriented  Motor 4- RUE and RLE proximal to distal Skin: Warm and dry   Assessment/Plan: 1. 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 Physiatrist is providing close team supervision and 24 hour management of active medical problems listed below. Physiatrist and rehab team continue to assess barriers to discharge/monitor patient progress toward functional and medical goals.   FIM: Function -  Bathing Position: Shower Body parts bathed by patient: Right arm, Left arm, Chest, Abdomen, Front perineal area, Right upper leg, Left upper leg Body parts bathed by helper: Back, Left lower leg, Right lower leg, Buttocks Bathing not applicable: Right arm, Right upper leg, Left arm, Left upper leg, Chest, Abdomen, Right lower leg, Left lower leg, Back Assist Level:  (patient completed 7/10, 70%, moderate assist FIM level 3)  Function- Upper Body Dressing/Undressing What is the patient wearing?: Pull over shirt/dress Pull over shirt/dress - Perfomed by patient: Thread/unthread right sleeve, Thread/unthread left sleeve, Put head through opening, Pull shirt over trunk Assist Level: Supervision or verbal cues Function - Lower Body Dressing/Undressing What is the patient wearing?: Pants, Socks, [employer], Underwear Position: Education officer, museum at [employer] - Performed by patient: Thread/unthread right underwear leg, Thread/unthread left underwear leg, Pull underwear up/down Pants- Performed by patient: Thread/unthread left pants leg, Pull pants up/down Pants- Performed by helper: Pull pants up/down Non-skid slipper socks- Performed by helper: Don/doff right sock, Don/doff left sock Socks - Performed by patient: Don/doff right sock, Don/doff left sock (used sockaide) Socks - Performed by helper: Don/doff right sock, Don/doff left sock TED Hose - Performed by helper: Don/doff right TED hose, Don/doff left TED hose  Function - Toileting Toileting activity did not occur: No continent bowel/bladder event Toileting steps completed by patient: Adjust clothing prior to toileting Toileting steps completed by helper: Adjust clothing prior to toileting, Performs perineal hygiene, Adjust clothing after toileting Toileting Assistive Devices: Grab bar or rail Assist level: Two helpers  Function - Air cabin crew transfer  assistive device: Elevated toilet seat/BSC over toilet Assist level to  toilet: Moderate assist (Pt 50 - 74%/lift or lower) Assist level from toilet: Moderate assist (Pt 50 - 74%/lift or lower)  Function - Chair/bed transfer Chair/bed transfer method: Stand pivot Chair/bed transfer assist level: Touching or steadying assistance (Pt > 75%) Chair/bed transfer assistive device: Walker, Armrests Chair/bed transfer details: Verbal cues for technique, Manual facilitation for weight shifting, Verbal cues for safe use of DME/AE  Function - Locomotion: Wheelchair Will patient use wheelchair at discharge?: No Type: Manual Max wheelchair distance: 150 Assist Level: Supervision or verbal cues Assist Level: Supervision or verbal cues Wheel 150 feet activity did not occur: Safety/medical concerns Assist Level: Supervision or verbal cues Turns around,maneuvers to table,bed, and toilet,negotiates 3% grade,maneuvers on rugs and over doorsills: No Function - Locomotion: Ambulation Assistive device: Walker-rolling Max distance: 150 Assist level: Touching or steadying assistance (Pt > 75%) Assist level: Touching or steadying assistance (Pt > 75%) Walk 50 feet with 2 turns activity did not occur: Safety/medical concerns Assist level: Touching or steadying assistance (Pt > 75%) Walk 150 feet activity did not occur: Safety/medical concerns Assist level: Touching or steadying assistance (Pt > 75%) Walk 10 feet on uneven surfaces activity did not occur: Safety/medical concerns  Function - Comprehension Comprehension: Auditory Comprehension assist level: Understands complex 90% of the time/cues 10% of the time  Function - Expression Expression: Verbal Expression assist level: Expresses complex 90% of the time/cues < 10% of the time  Function - Social Interaction Social Interaction assist level: Interacts appropriately 90% of the time - Needs monitoring or encouragement for participation or interaction.  Function - Problem Solving Problem solving assist level: Solves basic  50 - 74% of the time/requires cueing 25 - 49% of the time  Function - Memory Memory assist level: Recognizes or recalls 25 - 49% of the time/requires cueing 50 - 75% of the time Patient normally able to recall (first 3 days only): Current season, Location of own room, That he or she is in a hospital  Medical Problem List and Plan: 1. Right side hemisensory deficits, reduced coordination and altered mental status secondary to left PCA/temporal lobe infarct- RIght homonymous hemianopsia      2. DVT Prophylaxis/Anticoagulation: Subcutaneous Lovenox for DVT prophylaxis 3. Pain Management: Tylenol as needed 4. History of hypertension. Norvasc 5 mg daily.   Monitor with increased mobility-121/92, CVA risk factor   5. Neuropsych: This patient is capable of making decisions on his own behalf. 6. Skin/Wound Care: Routine skin checks 7. Fluids/Electrolytes/Nutrition: 50-100% meals for the most part. Routine I&O normal BMET 12/27 8. Morbid obesity. Dietary consult 9. Hyperlipidemia. Lipitor- CVA risk factor 10. Tobacco abuse. Nicoderm patch. Provide counseling, CVA risk factor-discussed with pt, pt plans to quit   LOS (Days) 8 A FACE TO FACE EVALUATION WAS [place] E 05/29/2015, 7:07 AM

## 2015-05-29 NOTE — Progress Notes (Signed)
Speech Language Pathology Daily Session Note  Patient Details  Name: Bryan Schultz MRN: [id number] Date of Birth: 09/23/1955  Today's Date: 05/29/2015 SLP Individual Time: [id number] SLP Individual Time Calculation (min): 57 min  Short Term Goals: Week 1: SLP Short Term Goal 1 (Week 1): Patient will demonstrate problem solving for functional and familiar tasks with Min A verbal and question cues.  SLP Short Term Goal 2 (Week 1): Patient will recall new, daily information with use of memory compensatory strategies with Mod A verbal and question cues.  SLP Short Term Goal 3 (Week 1): Patient will demonstrate sustained attention to a functional task for 30 minutes with supervision verbal cues for redirection.  SLP Short Term Goal 4 (Week 1): Patient will identify 2 cognitive deficits with supervision verbal cues.  SLP Short Term Goal 5 (Week 1): Patient will attend to right field of enviornment during functional tasks with Mod A verbal and visual cues.   Skilled Therapeutic Interventions: Skilled treatment session focused on cognitive goals. SLP facilitated session by providing Max A question cues for recall of his current medications and their functions. However, patient organized a 2 time per day pill box with Min A verbal cues for problem solving. Patient required Mod A question cues for recall of current date and to identify goals/events from previous therapy sessions. Patient left upright in wheelchair with quick release belt in place and all needs within reach. Continue with current plan of care.    Function:   Cognition Comprehension Comprehension assist level: Understands complex 90% of the time/cues 10% of the time  Expression   Expression assist level: Expresses complex 90% of the time/cues < 10% of the time  Social Interaction Social Interaction assist level: Interacts appropriately 90% of the time - Needs monitoring or encouragement for participation or interaction.  Problem Solving  Problem solving assist level: Solves basic 50 - 74% of the time/requires cueing 25 - 49% of the time  Memory Memory assist level: Recognizes or recalls 25 - 49% of the time/requires cueing 50 - 75% of the time    Pain Pain Assessment Pain Assessment: No/denies pain  Therapy/Group: Individual Therapy  Angie Hogg 05/29/2015, 4:11 PM

## 2015-05-29 NOTE — Progress Notes (Signed)
Occupational Therapy Note  Patient Details  Name: Bryan Schultz MRN: [id number] Date of Birth: 09/18/1955  Today's Date: 05/29/2015 OT Individual Time: 1330-1400 OT Individual Time Calculation (min): 30 min   Pt denied pain Individual Therapy  Pt asleep in recliner upon arrival but easily aroused.  Pt engaged in [facility] tasks with RUE/hand requiring patient to remove small beads from [employer].  Pt also required to pick up beads from table with right hand.  Pt noted with difficulty with fine motor grasp with improved success when watching his hand retrieve item.  Pt noted with difficulty locating items place in right visual field.  Pt required min verbal cues to scan to right to locate items.   Lavone NeriLanier, Renna Kilmer [facility] 05/29/2015, 2:15 PM

## 2015-05-29 NOTE — Progress Notes (Addendum)
Occupational Therapy Session Note  Patient Details  Name: Bryan Schultz MRN: [id number] Date of Birth: 04/06/1956  Today's Date: 05/29/2015 OT Individual Time: 0901-1000 OT Individual Time Calculation (min): 59 min    Short Term Goals: Week 1:  OT Short Term Goal 1 (Week 1): Pt will complete tub bench transfer in tub/shower with mod assist OT Short Term Goal 2 (Week 1): Pt will complete bathing seated using AE to reach to feet and back with min assist OT Short Term Goal 3 (Week 1): Pt will demo ability to complete 3 of 4 grooming tasks using right hand as dominant with supervision OT Short Term Goal 4 (Week 1): Pt will complete toileting with mod assist for thoroughness OT Short Term Goal 5 (Week 1): Pt will dress lower body using AE with mod assist.  Skilled Therapeutic Interventions/Progress Updates:    Pt completed toileting, bathing, and dressing during session.  Min assist for transfer to the toilet and to the walk-in shower.  He utilized toilet aide for hygiene but he still needs assistance for thoroughness.  He completed transfer to the shower with min assist.  Provided reacher and LH sponge for washing and drying LEs.  Pt also used reacher for donning new brief and pants over his feet.  Mod assist for balance in standing to pull over hips, with pt exhibiting LOB posteriorly when attempting to pull up pants.  Bryan Schultz continues to need mod instructional cueing for hand placement during sit to stand as well as he tends to try and pull up on the walker instead of pushing up on the wheelchair arms.  Encouraged scanning to the right side as well for locating of grooming items, clothing, and environmental barriers.  Pt left in wheelchair with call button in place and safety belt in place.    Therapy Documentation Precautions:  Precautions Precautions: Fall Precaution Comments: RUE and RLE weakness and sensory deficits Restrictions Weight Bearing Restrictions: No  Pain: Pain  Assessment Pain Assessment: No/denies pain Pain Score: 3  Pain Type: Acute pain Pain Location: Head Pain Descriptors / Indicators: Aching Pain Frequency: Intermittent Pain Onset: Gradual Pain Intervention(s): Medication (See eMAR) ADL: See Function Navigator for Current Functional Status.   Therapy/Group: Individual Therapy  Montez Stryker OTR/L 05/29/2015, 12:20 PM

## 2015-05-29 NOTE — Progress Notes (Addendum)
Physical Therapy Note  Patient Details  Name: Bryan Schultz MRN: [id number] Date of Birth: 11/18/1955 Today's Date: 05/29/2015    Time: 1015-1055 40 minutes  1:1 No c/o pain.  Simulated car transfer to small sedan with RW with min A for transfer, pt able to sit <> stand from car with supervision, cues for safety, pt able to get LEs in/out of car without assistance.  Bed mobility in ADL apartment with mod I.  Gait on carpet and with obstacle negotiation to simulate home environment with RW with supervision-min A, min cuing for R attention. Pt with minor LOB with turns, min A to correct. Pt still with decreased awareness of deficits, decreased R attention.  Time: 1430-1458 28 minutes  1:1 No c/o pain.  Pt performed [place] exercise program for LE strength and balance. Pt able to perform with visual and verbal cues from PT with supervision.  Pt able to gait in room with RW and min steadying assist with cuing to attend to R side to avoid obstacles.   [name] 05/29/2015, 11:45 AM

## 2015-05-29 NOTE — Progress Notes (Signed)
Physical Therapy Weekly Progress Note  Patient Details  Name: Bryan Schultz MRN: [id number] Date of Birth: 07/31/1955  Beginning of progress report period: May 21, 2015 End of progress report period: May 29, 2015   Patient has met 2 of 2 short term goals.  Pt has improved transfers, gait and mobility and is consistently supervision-min A level.  Patient continues to demonstrate the following deficits: decreased awareness of deficits, R attention, impaired balance and strength and therefore will continue to benefit from skilled PT intervention to enhance overall performance with activity tolerance, balance, postural control, ability to compensate for deficits, functional use of  right upper extremity and right lower extremity, attention, awareness and coordination.  Patient progressing toward long term goals..  Continue plan of care.  PT Short Term Goals Week 1:  PT Short Term Goal 1 (Week 1): Pt will perform functional transfers wtih min A PT Short Term Goal 1 - Progress (Week 1): Met PT Short Term Goal 2 (Week 1): Pt will gait with mod A 50' in controlled environment PT Short Term Goal 2 - Progress (Week 1): Met  Skilled Therapeutic Interventions/Progress Updates:  Ambulation/gait training;DME/adaptive equipment instruction;UE/LE Strength taining/ROM;Balance/vestibular training;Community reintegration;Discharge planning;Patient/family education;Therapeutic Exercise;Therapeutic Activities;Stair training;Wheelchair propulsion/positioning;Neuromuscular re-education;Splinting/orthotics;Functional mobility training;Functional electrical stimulation;UE/LE Coordination activities    See Function Navigator for Current Functional Status.   DONAWERTH,KAREN 05/29/2015, 11:48 AM   

## 2015-05-30 ENCOUNTER — Inpatient Hospital Stay (HOSPITAL_COMMUNITY): Payer: Medicaid Other | Admitting: Speech Pathology

## 2015-05-30 ENCOUNTER — Inpatient Hospital Stay (HOSPITAL_COMMUNITY): Payer: Medicaid Other

## 2015-05-30 ENCOUNTER — Inpatient Hospital Stay (HOSPITAL_COMMUNITY): Payer: Medicaid Other | Admitting: Occupational Therapy

## 2015-05-30 NOTE — Progress Notes (Signed)
Speech Language Pathology Daily Session Note  Patient Details  Name: Bryan Schultz MRN: [id number] Date of Birth: 08/13/1955  Today's Date: 05/30/2015 SLP Individual Time: 1305-1400 SLP Individual Time Calculation (min): 55 min  Short Term Goals: Week 1: SLP Short Term Goal 1 (Week 1): Patient will demonstrate problem solving for functional and familiar tasks with Min A verbal and question cues.  SLP Short Term Goal 2 (Week 1): Patient will recall new, daily information with use of memory compensatory strategies with Mod A verbal and question cues.  SLP Short Term Goal 3 (Week 1): Patient will demonstrate sustained attention to a functional task for 30 minutes with supervision verbal cues for redirection.  SLP Short Term Goal 4 (Week 1): Patient will identify 2 cognitive deficits with supervision verbal cues.  SLP Short Term Goal 5 (Week 1): Patient will attend to right field of enviornment during functional tasks with Mod A verbal and visual cues.   Skilled Therapeutic Interventions:  Pt was seen for skilled ST targeting cognitive goals.  Pt did not recall medication management tasks from yesterday's therapy session despite repetition of task.  Pt still required min assist verbal cues to organize pills into a pill box.  SLP also facilitated the session with a novel card game targeting visual scanning to the right.  Pt required mod assist verbal and visual cues to locate cards on the right side of the table that equaled 11 when added together.  Pt selectively attended to the abovementioned task in a moderately distracting environment for 20 minutes with min verbal cues for redirection.  Pt was returned to room and left with call bell within reach.  Continue per current plan of care.    Function:  Eating Eating                 Cognition Comprehension Comprehension assist level: Understands complex 90% of the time/cues 10% of the time  Expression   Expression assist level: Expresses basic  needs/ideas: With extra time/assistive device  Social Interaction Social Interaction assist level: Interacts appropriately 75 - 89% of the time - Needs redirection for appropriate language or to initiate interaction.  Problem Solving Problem solving assist level: Solves basic 50 - 74% of the time/requires cueing 25 - 49% of the time  Memory Memory assist level: Recognizes or recalls 50 - 74% of the time/requires cueing 25 - 49% of the time    Pain Pain Assessment Pain Assessment: No/denies pain  Therapy/Group: Individual Therapy  Usbaldo Pannone, Melanee SpryNicole L 05/30/2015, 2:29 PM

## 2015-05-30 NOTE — Plan of Care (Signed)
Problem: RH Tub/Shower Transfers Goal: LTG Patient will perform tub/shower transfers w/assist (OT) LTG: Patient will perform tub/shower transfers with assist, with/without cues using equipment (OT)  Goals downgraded to min assist level based on pt progress.

## 2015-05-30 NOTE — Progress Notes (Signed)
Physical Therapy Session Note  Patient Details  Name: Bryan Schultz MRN: [id number] Date of Birth: 05/16/1956  Today's Date: 05/30/2015 PT Individual Time:0908   - 1030, 82 min    Short Term Goals: Week 2:  PT Short Term Goal 1 (Week 2): pt will perform basic transfers consistently with superviison, using RW PRN PT Short Term Goal 2 (Week 2): pt will demonstrate improved R attention by propelling w/c throughout obstacle course x 50' without encountering obstacles on R 75% ot attempts. PT Short Term Goal 3 (Week 2): pt will perform bil UE task at counter PT Short Term Goal 4 (Week 2): pt will perform gait x 150' with L andf R turns, with superviision , without LOB during turns or R knee buckling PT Short Term Goal 5 (Week 2): pt will ascend/descend 4 6' steps 2 rails with supervision  Skilled Therapeutic Interventions/Progress Updates: w/c propulsion using bil UEs x 150' with supervision for obstacles on R.  neuromuscular re-education via forced use, visual feedback VCs for NuStep at level 4 x 4 extremities x 5 minutes and bil LEs x 5 minutes, focusing on attention to RLE neutral hip rotation ; R step /taps onto 6" high step x 6; standing calf raises with bil UE support, mini squats R foot ahead/ L foot ahead; bil toe raises.  1 x 5 R toe raises.  Patient demonstrates increased fall risk as noted by score of  14 /56 on Berg Balance Scale.  (<36= high risk for falls, close to 100%; 37-45 significant >80%; 46-51 moderate >50%; 52-55 lower >25%). PT discussed with pt the significance of balance deficits and need to use RW at all times for now.  Pt returned to room using w/c and left with all needs iwthin reach, sitting in w/c.    Therapy Documentation Precautions:  Precautions Precautions: Fall Precaution Comments: RUE and RLE weakness and sensory deficits Restrictions Weight Bearing Restrictions: No Therapy Vitals Temp: 98 F (36.7 C) Temp Source: Oral Pulse Rate: 79 Resp: 18 BP: (!) 158/82  mmHg Patient Position (if appropriate): Sitting Oxygen Therapy SpO2: 92 % O2 Device: Not Delivered Pain: Pain Assessment Pain Assessment: No/denies pain    Balance: Balance Balance Assessed: Yes Standardized Balance Assessment Standardized Balance Assessment: Berg Balance Test Berg Balance Test Sit to Stand: Needs minimal aid to stand or to stabilize Standing Unsupported: Able to stand 30 seconds unsupported Sitting with Back Unsupported but Feet Supported on Floor or Stool: Able to sit safely and securely 2 minutes Stand to Sit: Sits independently, has uncontrolled descent Transfers: Needs one person to assist Standing Unsupported with Eyes Closed: Able to stand 10 seconds with supervision Standing Ubsupported with Feet Together: Needs help to attain position and unable to hold for 15 seconds From Standing, Reach Forward with Outstretched Arm: Loses balance while trying/requires external support From Standing Position, Pick up Object from Floor: Unable to try/needs assist to keep balance From Standing Position, Turn to Look Behind Over each Shoulder: Needs supervision when turning Turn 360 Degrees: Needs assistance while turning Standing Unsupported, Alternately Place Feet on Step/Stool: Needs assistance to keep from falling or unable to try Standing Unsupported, One Foot in Front: Needs help to step but can hold 15 seconds Standing on One Leg: Unable to try or needs assist to prevent fall Total Score: 14    See Function Navigator for Current Functional Status.   Therapy/Group: Individual Therapy  Jamill Wetmore 05/30/2015, 4:02 PM

## 2015-05-30 NOTE — Plan of Care (Signed)
Problem: Food- and Nutrition-Related Knowledge Deficit (NB-1.1) Goal: Nutrition education Formal process to instruct or train a patient/client in a skill or to impart knowledge to help patients/clients voluntarily manage or modify food choices and eating behavior to maintain or improve health. Outcome: Completed/Met Date Met:  05/30/15 Nutrition Education Note  RD consulted for nutrition education.   RD provided "Heart Healthy Nutrition Therapy", "Low Sodium Nutrition Therapy", and "Weight Loss Tips" handout from the Academy of [employer]. Reviewed patient's dietary recall. Provided examples on ways to decrease sodium and fat intake in diet. Discouraged intake of processed foods and use of salt shaker. Encouraged fresh fruits and vegetables as well as whole grain sources of carbohydrates to maximize fiber intake. Recommended a 1-2 lb weight loss once discharged. Discussed a deficit of 500 kcal/day to aid in weight loss.Teach back method used.  Expect good compliance.  Body mass index is 48.75 kg/(m^2). Pt meets criteria for morbid obesity based on current BMI.  Current diet order is heart healthy, patient is consuming approximately 75-100% of meals at this time. Labs and medications reviewed. No further nutrition interventions warranted at this time. RD contact information provided. If additional nutrition issues arise, please re-consult RD.  Corrin Parker, MS, RD, LDN Pager # [phone] After hours/ weekend pager # [phone]

## 2015-05-30 NOTE — Progress Notes (Signed)
Occupational Therapy Weekly Progress Note  Patient Details  Name: Bryan Schultz MRN: [id number] Date of Birth: 01/25/1956  Beginning of progress report period: May 22, 2015 End of progress report period: May 30, 2015  Today's Date: 05/30/2015 OT Individual Time: 4627-0350 OT Individual Time Calculation (min): 59 min    Patient has met 3 of 5 short term goals.  Bryan Schultz continues to make steady progress with OT to an overall min assist level currently for functional transfers related to selfcare tasks as well as performance of ADLs with DME/AE.  He needs use of a toilet aide for toilet hygiene but still requires max assist for thoroughness.  He is able to utilize AE (reacher, LH sponge, sockaide) for LB dressing tasks with increased time and min instructional cueing.  Occasional buckling of the right knee is noted as well with LOB to the right and posteriorly when attempting to pull up pants without UE support.  Decreased coordination and sensation noted in the RUE as well with pt demonstrating overall good functional use with selfcare tasks.  Bryan Schultz continues to demonstrate a moderate right visual field deficit as well and needs mod instructional cueing to avoid bumping into environmental barriers on the right side as well as for locating clothing and items placed on the left side.  Decreased carryover is still noted from session to session requring mod instructional cueing for hand placement with sit to stand.  Feel he is on target to meet most supervision level goals for discharge.  Will continue with current treatment plan and downgrade toileting goal as I anticipate that he will need min assist or more for this at discharge.    Patient continues to demonstrate the following deficits: decreased balance, right hemianopsia, RUE and RLE coordination and strength difficulties, and therefore will continue to benefit from skilled OT intervention to enhance overall performance with BADL.  Patient  progressing toward long term goals..  Plan of care revisions: LTGs downgraded for toileting.  OT Short Term Goals Week 2:  OT Short Term Goal 1 (Week 2): Pt will complete toilet hygiene sit to stand with mod assist using toilet aide. OT Short Term Goal 2 (Week 2): Pt will complete tub bench transfer in tub/shower with mod assist OT Short Term Goal 3 (Week 2): Pt will complete toilet transfer with supervision using RW and 3:1. OT Short Term Goal 4 (Week 2): Pt will ambulate to the bathroom without running into objects/environmental barriers on the right side.   OT Short Term Goal 5 (Week 2): Pt's friend will return demonstrate safe asssit with selfcare tasks.    Skilled Therapeutic Interventions/Progress Updates:    Pt worked on bathing and dressing during session.  Mod instructional cueing for hand placement with all transfers as pt tends to want to pull up on the walker and does not reach back for the chair when sitting.  He continues to run the walker into the door frame on the right side when walking to the bathroom, requiring mod instructional cueing to use a larger head turn to locate the environmental barriers.  AE used for LB bathing and dressing tasks.  Pt with decreased awareness this session and turned the water on in the shower before removing his gripper socks.  Continue to notice memory impairments from session to session.  Pt left in wheelchair at end of session secondary to PT session being directly after OT session.    Therapy Documentation Precautions:  Precautions Precautions: Fall Precaution Comments: RUE and  RLE weakness and sensory deficits Restrictions Weight Bearing Restrictions: No  Pain: Pain Assessment Pain Assessment: No/denies pain ADL: See Function Navigator for Current Functional Status.   Therapy/Group: Individual Therapy  Icelynn Onken OTR/L 05/30/2015, 1:26 PM

## 2015-05-30 NOTE — Patient Care Conference (Signed)
Inpatient RehabilitationTeam Conference and Plan of Care Update Date: 05/30/2015   Time: 10:45 AM    Patient Name: Bryan Schultz      Medical Record Number: [id number]  Date of Birth: May 05, 1956 Sex: Male         Room/Bed: 4W13C/4W13C-01 Payor Info: Payor: MEDICAID  / Plan: MEDICAID [place] ACCESS / Product Type: *No Product type* /    Admitting Diagnosis: cva  Admit Date/Time:  05/21/2015  3:05 PM Admission Comments: No comment available   Primary Diagnosis:  <principal problem not specified> Principal Problem: <principal problem not specified>  Patient Active Problem List   Diagnosis Date Noted  . 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 Essential hypertension   . Morbid obesity (HCC)   . HLD (hyperlipidemia)   . Tobacco abuse   . Dysmetria   . Ataxia, late effect of cerebrovascular disease   . CVA (cerebral vascular accident) (HCC) 05/19/2015  . Acute CVA (cerebrovascular accident) (HCC) 05/18/2015    Expected Discharge Date: Expected Discharge Date: 06/05/15  Team Members Present: Physician leading conference: Dr. Claudette Laws Social Worker Present: Dossie Der, LCSW Nurse Present: Carmie End, RN PT Present: Wanda Plump, PT OT Present: Perrin Maltese, OT SLP Present: Jackalyn Lombard, SLP PPS Coordinator present : Tora Duck, RN, CRRN     Current Status/Progress Goal Weekly Team Focus  Medical   minA toileting, dense R HH, poor sensation   to girlfriend's home  D/C planning   Bowel/Bladder   occasional bowel incontinence, continent of bladder, LBM 12/31  be congtinent of bowel and bladder with min assist  timed toileting q2h, medicate for constipation   Swallow/Nutrition/ Hydration     na        ADL's   Supervision for UB selfcare, min assist for LB bathing and dressing with AE.  Right visual field deficit as well with impaired memory and carryover  from session to session.    supervision overall  Neuromuscular re-education, selfcare re-training, transfer training, visual compensation techniques, pt/family education   Mobility   min A gait and transfers with RW  supervision overalll including gait x 150' and stairs 2 rails   safety, R attention, gait, balance   Communication     na        Safety/Cognition/ Behavioral Observations  moderate cognitive deficits, right inattention, decreased sustained attention, impulsive   supervision-min assist    address basic to semi-complex cognition    Pain   patient complains of occasional headaches   pain equal to or less than 3 on a scale of 0-10  Assess for pain q4h, medicate as indicated   Skin   Scrotum red, blanchable  no new skin injury/breakdown  Encourage patient to turn q2h while awake, keep skin clean and dry      *See Care Plan and progress notes for long and short-term goals.  Barriers to Discharge: poor memory, R neglect    Possible Resolutions to Barriers:  Cont rehab    Discharge Planning/Teaching Needs:  April to figure out discharge plan either her apartment or rental mobile home. She can only provide supervision level due to health issues and she is a small lady      Team Discussion:  Making progress in therapies, but needs to be aware of his right sided inattention and balance issues. Poor memory and sensation . Sharlene Motts 14/56 aware high risk to fall at  home. Pt wants to go home and not in a NH. Will need help cleaning himself at home. Need to do family education with April prior to discharge  Revisions to Treatment Plan:  None   Continued Need for Acute Rehabilitation Level of Care: The patient requires daily medical management by a physician with specialized training in physical medicine and rehabilitation for the following conditions: Daily direction of a multidisciplinary physical rehabilitation program to ensure safe treatment while eliciting the highest outcome that is  of practical value to the patient.: Yes Daily medical management of patient stability for increased activity during participation in an intensive rehabilitation regime.: Yes Daily analysis of laboratory values and/or radiology reports with any subsequent need for medication adjustment of medical intervention for : Neurological problems;Other  Lucy ChrisDupree, Tyger Wichman G 05/30/2015, 12:29 PM

## 2015-05-30 NOTE — Progress Notes (Signed)
Subjective/Complaints: Appreciate social work note. Patient seems unaware of discharge issues.  ROS- denies SOB, n/v, CP or sleep disturbance  Objective: Vital Signs: Blood pressure 148/61, pulse 86, temperature 98.1 F (36.7 C), temperature source Oral, resp. rate 18, height _0  (1.854 m), weight 167.559 kg (369 lb 6.4 oz), SpO2 94 %. No results found. Results for orders placed or performed during the hospital encounter of 05/21/15 (from the past 72 hour(s))  Glucose, capillary     Status: Abnormal   Collection Time: 05/27/15 11:23 AM  Result Value Ref Range   Glucose-Capillary 129 (H) 65 - 99 mg/dL   Comment 1 Notify RN   Creatinine, serum     Status: None   Collection Time: 05/28/15  7:50 AM  Result Value Ref Range   Creatinine, Ser 1.16 0.61 - 1.24 mg/dL   GFR calc non Af Amer >60 >60 mL/min   GFR calc Af Amer >60 >60 mL/min    Comment: (NOTE) The eGFR has been calculated using the CKD EPI equation. This calculation has not been validated in all clinical situations. eGFR's persistently <60 mL/min signify possible Chronic Kidney Disease.     Gen NAD. Vital signs reviewed HEENT: poor dentition. Normocephalic, atraumatic Cardio: RRR and no murmur Resp: CTA B/L and unlabored GI: BS positive and not tender Musc/Skel:  No tenderness. No Edema Neuro: Alert and Oriented  Motor 4- RUE and RLE proximal to distal Skin: Warm and dry   Assessment/Plan: 1. 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 Physiatrist is providing close team supervision and 24 hour management of active medical problems listed below. Physiatrist and rehab team continue to assess barriers to discharge/monitor patient progress toward functional and medical  goals.   FIM: Function - Bathing Position: Shower Body parts bathed by patient: Right arm, Left arm, Chest, Abdomen, Front perineal area, Right upper leg, Left upper leg, Right lower leg, Left lower leg Body parts bathed by helper: Back, Buttocks Bathing not applicable: Right arm, Right upper leg, Left arm, Left upper leg, Chest, Abdomen, Right lower leg, Left lower leg, Back Assist Level:  (patient completed 7/10, 70%, moderate assist FIM level 3)  Function- Upper Body Dressing/Undressing What is the patient wearing?: Pull over shirt/dress Pull over shirt/dress - Perfomed by patient: Thread/unthread right sleeve, Thread/unthread left sleeve, Put head through opening, Pull shirt over trunk Assist Level: Supervision or verbal cues Function - Lower Body Dressing/Undressing What is the patient wearing?: Pants, Socks, [employer], Underwear Position: Education officer, museum at [employer] - Performed by patient: Thread/unthread right underwear leg, Thread/unthread left underwear leg Pants- Performed by patient: Thread/unthread left pants leg, Pull pants up/down Pants- Performed by helper: Pull pants up/down Non-skid slipper socks- Performed by helper: Don/doff right sock, Don/doff left sock Socks - Performed by patient: Don/doff right sock, Don/doff left sock (used sockaide) Socks - Performed by helper: Don/doff right sock, Don/doff left sock TED Hose - Performed by helper: Don/doff right TED hose, Don/doff left TED hose  Function - Toileting Toileting activity did not occur: No continent bowel/bladder event Toileting steps completed by patient: Adjust clothing prior to toileting Toileting steps completed by helper: Performs perineal hygiene, Adjust clothing after toileting Toileting Assistive Devices: Grab bar or rail Assist level: Two helpers  Function - Air cabin crew transfer assistive device: Elevated toilet seat/BSC over toilet Assist level  to toilet: Moderate assist (Pt 50 -  74%/lift or lower) Assist level from toilet: Moderate assist (Pt 50 - 74%/lift or lower)  Function - Chair/bed transfer Chair/bed transfer method: Stand pivot Chair/bed transfer assist level: Touching or steadying assistance (Pt > 75%) Chair/bed transfer assistive device: Walker, Armrests Chair/bed transfer details: Verbal cues for technique, Manual facilitation for weight shifting, Verbal cues for safe use of DME/AE  Function - Locomotion: Wheelchair Will patient use wheelchair at discharge?: No Type: Manual Max wheelchair distance: 150 Assist Level: Supervision or verbal cues Assist Level: Supervision or verbal cues Wheel 150 feet activity did not occur: Safety/medical concerns Assist Level: Supervision or verbal cues Turns around,maneuvers to table,bed, and toilet,negotiates 3% grade,maneuvers on rugs and over doorsills: No Function - Locomotion: Ambulation Assistive device: Walker-rolling Max distance: 150 Assist level: Touching or steadying assistance (Pt > 75%) Assist level: Touching or steadying assistance (Pt > 75%) Walk 50 feet with 2 turns activity did not occur: Safety/medical concerns Assist level: Touching or steadying assistance (Pt > 75%) Walk 150 feet activity did not occur: Safety/medical concerns Assist level: Touching or steadying assistance (Pt > 75%) Walk 10 feet on uneven surfaces activity did not occur: Safety/medical concerns  Function - Comprehension Comprehension: Auditory Comprehension assist level: Understands complex 90% of the time/cues 10% of the time  Function - Expression Expression: Verbal Expression assist level: Expresses complex 90% of the time/cues < 10% of the time  Function - Social Interaction Social Interaction assist level: Interacts appropriately 90% of the time - Needs monitoring or encouragement for participation or interaction.  Function - Problem Solving Problem solving assist level: Solves basic 50 - 74% of the time/requires  cueing 25 - 49% of the time  Function - Memory Memory assist level: Recognizes or recalls 25 - 49% of the time/requires cueing 50 - 75% of the time Patient normally able to recall (first 3 days only): Current season, Location of own room, That he or she is in a hospital  Medical Problem List and Plan: 1. Right side hemisensory deficits, reduced coordination and altered mental status secondary to left PCA/temporal lobe infarct- RIght homonymous hemianopsia      2. DVT Prophylaxis/Anticoagulation: Subcutaneous Lovenox for DVT prophylaxis Team conference today please see physician documentation under team conference tab, met with team face-to-face to discuss problems,progress, and goals. Formulized individual treatment plan based on medical history, underlying problem and comorbidities. 3. Pain Management: Tylenol as needed 4. History of hypertension. Norvasc 5 mg daily.   Monitor with increased mobility-148/61,    5. Neuropsych: This patient is capable of making decisions on his own behalf. 6. Skin/Wound Care: Routine skin checks 7. Fluids/Electrolytes/Nutrition: 50-100% meals for the most part. Routine I&O normal BMET 12/27 8. Morbid obesity. Dietary consult, not done will order 9. Hyperlipidemia. Lipitor- CVA risk factor 10. Tobacco abuse. Nicoderm patch. Provide counseling,   LOS (Days) 9 A FACE TO FACE EVALUATION WAS PERFORMED  KIRSTEINS,ANDREW E 05/30/2015, 8:25 AM

## 2015-05-30 NOTE — Progress Notes (Signed)
Social Work Patient ID: Keysean Savino, male   DOB: [birth year], 60 y.o.   MRN: [id number] Met with pt and spoke with April-friend regarding team conference goals-supervision level and discharge 1/10. She is trying to get her car fixed but is wiating for parts for it. She will try to get up here Monday to attend therapies with him. The plan is home with her she has worked it out with her landlord. Discussed helping him in the bathroom and cueing him.She reports: " This Is not a problem I helped his sister who is disabled, I can do that."  Discussed home health follow up and got her address from her while on the telephone. She is aware of his memory issues also. She will see if their friend can come to pick up pt otherwise he may need to go home via ambulance. Will work on discharge plans, aware of other options of NHP, but want to try home first.

## 2015-05-31 ENCOUNTER — Inpatient Hospital Stay (HOSPITAL_COMMUNITY): Payer: Medicaid Other

## 2015-05-31 ENCOUNTER — Inpatient Hospital Stay (HOSPITAL_COMMUNITY): Payer: Medicaid Other | Admitting: Speech Pathology

## 2015-05-31 NOTE — Progress Notes (Signed)
Occupational Therapy Session Note  Patient Details  Name: Bryan Schultz MRN: [id number] Date of Birth: 10/01/1955  Today's Date: 05/31/2015 OT Individual Time: [id number] OT Individual Time Calculation (min): 60 min    Short Term Goals: Week 2:  OT Short Term Goal 1 (Week 2): Pt will complete toilet hygiene sit to stand with mod assist using toilet aide. OT Short Term Goal 2 (Week 2): Pt will complete tub bench transfer in tub/shower with mod assist OT Short Term Goal 3 (Week 2): Pt will complete toilet transfer with supervision using RW and 3:1. OT Short Term Goal 4 (Week 2): Pt will ambulate to the bathroom without running into objects/environmental barriers on the right side.   OT Short Term Goal 5 (Week 2): Pt's friend will return demonstrate safe asssit with selfcare tasks.    Skilled Therapeutic Interventions/Progress Updates: ADL-retraining at shower level with focus on improved FMC of RUE, AE retraining (reacher, LH sponge).   Pt received seated at EOB self-feeding.   Pt required setup and supervision to complete self-feeding d/t impaired problem-solving and awareness of all items presented on his tray, complicated by sensory and motor impaired at right hand requiring extra time.  Pt then rose to RW and ambulated to shower with min vc to avoid collision on right side.   Pt bathed seated using LH sponge, standing only to wash buttocks.   Pt returned to room in RW to sit at w/c and dress with setup and steadying assist while standing to pull up his pants.  OT re-educated pt on use of reacher to lace his pants.   Pt demo'd LOB 1 time during dressing but recovered to w/c placed behind him.   Pt remained in w/c at end of session with all needs within reach and QRB applied for safety.     Therapy Documentation Precautions:  Precautions Precautions: Fall Precaution Comments: RUE and RLE weakness and sensory deficits Restrictions Weight Bearing Restrictions: No  Vital Signs: Therapy  Vitals Temp: 98.5 F (36.9 C) Temp Source: Oral Pulse Rate: 89 Resp: 20 BP: (!) 153/100 mmHg Patient Position (if appropriate): Sitting Oxygen Therapy SpO2: 95 % O2 Device: Not Delivered   Pain: Pain Assessment Pain Assessment: No/denies pain  ADL: ADL ADL Comments: See Functional Assessment Tool  See Function Navigator for Current Functional Status.   Therapy/Group: Individual Therapy  Altus Zaino 05/31/2015, 9:00 AM

## 2015-05-31 NOTE — Progress Notes (Signed)
Physical Therapy Session Note  Patient Details  Name: Bryan Schultz MRN: [id number] Date of Birth: 04/12/1956  Today's Date: 05/31/2015 PT Individual Time: 1100-1205; 1430-1500 PT Individual Time Calculation (min): 65 min , 30 min  Short Term Goals: Week 2:  PT Short Term Goal 1 (Week 2): pt will perform basic transfers consistently with superviison, using RW PRN PT Short Term Goal 2 (Week 2): pt will demonstrate improved R attention by propelling w/c throughout obstacle course x 50' without encountering obstacles on R 75% ot attempts. PT Short Term Goal 3 (Week 2): pt will perform bil UE task at counter PT Short Term Goal 4 (Week 2): pt will perform gait x 150' with L andf R turns, with superviision , without LOB during turns or R knee buckling PT Short Term Goal 5 (Week 2): pt will ascend/descend 4 6' steps 2 rails with supervision     Skilled Therapeutic Interventions/Progress Updates:  tx 1:  W/c propulsion using bil UEs with supervision/min assist for R attention as pt will bump R hand on obstacles even with VCs.    neuromuscular re-education via forced use, visual feedback, VCS for R hip neutral rotation, R knee stance stability, R hip activation when using NuStep at level 5 x 5 min with 4 extremities, and x 5 min using bil LEs only; [place] A exs in standing: 10 x 1 each R/L hamstring curls, bil calf raises; standing on wedge for prolonged hamstring and heel cord stretching; trunk shortening/lengthening/rotating while reaching out of BOS with R hand for objects in sitting.  While standing on wedge, pt had LOB at 40-45 seconds both trials, with inadequate balance strategies to regain without LOB backwards to sit on mat.  Gait with RW x 75', x 150', up/down (4) 6" high stairs, bil rails, min guard/supervision assist, min cues for sequencing. Pt's R knee wobbles slightly with full wt bearing during gait; no buckling this session.     Pt left sitting in w/c with all needs within reach, quick  release belt applied. RN informed of pt c/o R hand numbness and HA during tx.  Tx 2:  Gait to/from room with RW, with slight R knee buckling during turns, and 1 LOB when turning r into room.  Neuro re-ed via forced use, VCS, visual feedback for fine grasp R hand from R>L in sitting, and standing requiring rotation, monitoring R knee, x 12 items.  No LOB, but r knee in flexion unless cued for extension.  Pt left sitting in w/c at end of session, with quick release belt donned and all needs within reach. Therapy Documentation Precautions:  Precautions Precautions: Fall Precaution Comments: RUE and RLE weakness and sensory deficits Restrictions Weight Bearing Restrictions: No   Vital Signs: Therapy Vitals Pulse Rate: 85 BP: (!) 156/78 mmHg Patient Position (if appropriate): Sitting (after 5 min ex) Pain: Pain Assessment Pain Score: 7  Pain Location: Head Pain Intervention(s): Medication (See eMAR) AM;  Pain -very little R knee, PM      See Function Navigator for Current Functional Status.   Therapy/Group: Individual Therapy  Normal Recinos 05/31/2015, 12:19 PM

## 2015-05-31 NOTE — Progress Notes (Signed)
Subjective/Complaints: No pain c/o, pt still numb on RIght side, pt aware of memory defs and visual field cut  ROS- denies SOB, n/v, CP or sleep disturbance  Objective: Vital Signs: Blood pressure 153/100, pulse 89, temperature 98.5 F (36.9 C), temperature source Oral, resp. rate 20, height 6' 1"  (1.854 m), weight 164.792 kg (363 lb 4.8 oz), SpO2 95 %. No results found. Results for orders placed or performed during the hospital encounter of 05/21/15 (from the past 72 hour(s))  Creatinine, serum     Status: None   Collection Time: 05/28/15  7:50 AM  Result Value Ref Range   Creatinine, Ser 1.16 0.61 - 1.24 mg/dL   GFR calc non Af Amer >60 >60 mL/min   GFR calc Af Amer >60 >60 mL/min    Comment: (NOTE) The eGFR has been calculated using the CKD EPI equation. This calculation has not been validated in all clinical situations. eGFR's persistently <60 mL/min signify possible Chronic Kidney Disease.     Gen NAD. Vital signs reviewed HEENT: poor dentition. Normocephalic, atraumatic Cardio: RRR and no murmur Resp: CTA B/L and unlabored GI: BS positive and not tender Musc/Skel:  No tenderness. No Edema Neuro: Alert and Oriented  Motor 4- RUE and RLE proximal to distal Skin: Warm and dry   Assessment/Plan: 1. 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 Physiatrist is providing close team supervision and 24 hour management of active medical problems listed below. Physiatrist and rehab team continue to assess barriers to discharge/monitor patient progress toward functional and medical goals.   FIM: Function - Bathing Position: Shower Body parts bathed by patient: Right arm, Left arm, Chest, Abdomen, Front perineal area, Right upper leg, Left upper leg,  Right lower leg, Left lower leg Body parts bathed by helper: Back, Buttocks Bathing not applicable: Right arm, Right upper leg, Left arm, Left upper leg, Chest, Abdomen, Right lower leg, Left lower leg, Back Assist Level:  (patient completed 7/10, 70%, moderate assist FIM level 3)  Function- Upper Body Dressing/Undressing What is the patient wearing?: Pull over shirt/dress Pull over shirt/dress - Perfomed by patient: Thread/unthread right sleeve, Thread/unthread left sleeve, Put head through opening, Pull shirt over trunk Assist Level: Supervision or verbal cues Function - Lower Body Dressing/Undressing What is the patient wearing?: Pants, Socks, [employer], Underwear Position: Education officer, museum at [employer] - Performed by patient: Thread/unthread right underwear leg, Thread/unthread left underwear leg Pants- Performed by patient: Thread/unthread left pants leg, Thread/unthread right pants leg, Pull pants up/down Pants- Performed by helper: Pull pants up/down Non-skid slipper socks- Performed by helper: Don/doff right sock, Don/doff left sock Socks - Performed by patient: Don/doff right sock, Don/doff left sock (used sockaide) Socks - Performed by helper: Don/doff right sock, Don/doff left sock TED Hose - Performed by helper: Don/doff right TED hose, Don/doff left TED hose  Function - Toileting Toileting activity did not occur: No continent bowel/bladder event Toileting steps completed by patient: Adjust clothing prior to toileting Toileting steps completed by helper: Performs perineal hygiene, Adjust clothing after toileting Toileting Assistive Devices: Grab bar or rail Assist level: Two helpers  Function - Air cabin crew transfer assistive device: Elevated toilet seat/BSC over toilet Assist level to toilet: Moderate assist (Pt 50 - 74%/lift or lower) Assist level from toilet: Moderate assist (Pt 50 - 74%/lift or lower)  Function -  Chair/bed transfer Chair/bed transfer  method: Ambulatory Chair/bed transfer assist level: Touching or steadying assistance (Pt > 75%) Chair/bed transfer assistive device: Walker, Armrests Chair/bed transfer details: Verbal cues for technique, Manual facilitation for weight shifting, Verbal cues for safe use of DME/AE  Function - Locomotion: Wheelchair Will patient use wheelchair at discharge?: Yes Type: Manual Max wheelchair distance: 150 Assist Level: Supervision or verbal cues Assist Level: Supervision or verbal cues Wheel 150 feet activity did not occur: Safety/medical concerns Assist Level: Supervision or verbal cues Turns around,maneuvers to table,bed, and toilet,negotiates 3% grade,maneuvers on rugs and over doorsills: No Function - Locomotion: Ambulation Assistive device: Walker-rolling Max distance: 90 Assist level: Touching or steadying assistance (Pt > 75%) Assist level: Touching or steadying assistance (Pt > 75%) Walk 50 feet with 2 turns activity did not occur: Safety/medical concerns Assist level: Touching or steadying assistance (Pt > 75%) Walk 150 feet activity did not occur: Safety/medical concerns Assist level: Touching or steadying assistance (Pt > 75%) Walk 10 feet on uneven surfaces activity did not occur: Safety/medical concerns  Function - Comprehension Comprehension: Auditory Comprehension assist level: Understands complex 90% of the time/cues 10% of the time  Function - Expression Expression: Verbal Expression assist level: Expresses basic needs/ideas: With extra time/assistive device  Function - Social Interaction Social Interaction assist level: Interacts appropriately 75 - 89% of the time - Needs redirection for appropriate language or to initiate interaction.  Function - Problem Solving Problem solving assist level: Solves basic 50 - 74% of the time/requires cueing 25 - 49% of the time  Function - Memory Memory assist level: Recognizes or recalls 50 - 74% of the time/requires cueing 25  - 49% of the time Patient normally able to recall (first 3 days only): Current season, Location of own room, That he or she is in a hospital  Medical Problem List and Plan: 1. Right side hemisensory deficits, reduced coordination and altered mental status secondary to left PCA/temporal lobe infarct- RIght homonymous hemianopsia      2. DVT Prophylaxis/Anticoagulation: Subcutaneous Lovenox for DVT prophylaxis At this point plan is to go home with friend at her appt 3. Pain Management: Tylenol as needed 4. History of hypertension. Norvasc 5 mg daily.   Monitor with increased mobility-153/100, generally creeping up, will increase Norvasc to 73m,    5. Neuropsych: This patient is capable of making decisions on his own behalf. 6. Skin/Wound Care: Routine skin checks 7. Fluids/Electrolytes/Nutrition: 50-100% meals for the most part. Routine I&O normal BMET 12/27 8. Morbid obesity. Dietary consult, appreciate eval and teaching 9. Hyperlipidemia. Lipitor- CVA risk factor 10. Tobacco abuse. Nicoderm patch. Provide counseling,   LOS (Days) 10 A FACE TO FACE EVALUATION WAS PERFORMED  Kenli Waldo E 05/31/2015, 7:05 AM

## 2015-05-31 NOTE — Progress Notes (Signed)
Speech Language Pathology Daily Session Note  Patient Details  Name: Bryan Schultz MRN: [id number] Date of Birth: 03/18/1956  Today's Date: 05/31/2015 SLP Individual Time: 0903-1000 SLP Individual Time Calculation (min): 57 min  Short Term Goals: Week 1: SLP Short Term Goal 1 (Week 1): Patient will demonstrate problem solving for functional and familiar tasks with Min A verbal and question cues.  SLP Short Term Goal 2 (Week 1): Patient will recall new, daily information with use of memory compensatory strategies with Mod A verbal and question cues.  SLP Short Term Goal 3 (Week 1): Patient will demonstrate sustained attention to a functional task for 30 minutes with supervision verbal cues for redirection.  SLP Short Term Goal 4 (Week 1): Patient will identify 2 cognitive deficits with supervision verbal cues.  SLP Short Term Goal 5 (Week 1): Patient will attend to right field of enviornment during functional tasks with Mod A verbal and visual cues.   Skilled Therapeutic Interventions:  Pt was seen for skilled ST targeting cognitive goals.  SLP facilitated the session with a basic grocery shopping task targeting functional problem solving, attention to task, and visual scanning.  Pt required max assist verbal cues for use of finger tracking to scan from left to right to locate items from a grocery store flyer.  Pt was able to sustain his attention to task in a mildly distracting environment for ~5 minutes with mod verbal cues for redirection to task.  Pt also benefited from min verbal cues for working memory of targeted task items.  Pt was able to complete mental math calculations to generate a total for targeted items, although task was ended early due to pt's complaints of headache when focusing on small print.   SLP also facilitated the session with a novel card game targeting functional problem solving and recall of new information.  Pt was able to plan and execute a problem solving strategy with  overall min assist verbal cues following initial instruction.  Pt was also able to recall 3 rules of the game with supervision question cues following a 15 minute delay.  Pt was returned to room and left upright in wheelchair with quick release belt donned.    Function:  Eating Eating                 Cognition Comprehension Comprehension assist level: Understands complex 90% of the time/cues 10% of the time  Expression   Expression assist level: Expresses basic needs/ideas: With extra time/assistive device  Social Interaction Social Interaction assist level: Interacts appropriately 75 - 89% of the time - Needs redirection for appropriate language or to initiate interaction.  Problem Solving Problem solving assist level: Solves basic 50 - 74% of the time/requires cueing 25 - 49% of the time  Memory Memory assist level: Recognizes or recalls 50 - 74% of the time/requires cueing 25 - 49% of the time    Pain Pain Assessment Pain Assessment: 0-10 Pain Score: 7  Pain Location: Head Pain Descriptors / Indicators: Aching Pain Intervention(s): RN made aware  Therapy/Group: Individual Therapy  Jla Reynolds, Melanee SpryNicole L 05/31/2015, 1:37 PM

## 2015-05-31 NOTE — Progress Notes (Signed)
Social Work Patient ID: Bryan CelesteJohn Schultz, male   DOB: 06/15/1955, [age] y.o.   MRN: [id number] Spoke with April she is hoping to get here tomorrow if her car gets fixed today. She wants to attend therapies and learn his care.  Will contact her tomorrow to see if she is coming. Pt aware and hopes she can come.

## 2015-06-01 ENCOUNTER — Inpatient Hospital Stay (HOSPITAL_COMMUNITY): Payer: Medicaid Other | Admitting: Occupational Therapy

## 2015-06-01 ENCOUNTER — Inpatient Hospital Stay (HOSPITAL_COMMUNITY): Payer: Medicaid Other | Admitting: Speech Pathology

## 2015-06-01 ENCOUNTER — Inpatient Hospital Stay (HOSPITAL_COMMUNITY): Payer: Medicaid Other | Admitting: Physical Therapy

## 2015-06-01 MED ORDER — WHITE PETROLATUM GEL
Status: AC
  Administered 2015-06-01: 0.2
  Filled 2015-06-01: qty 1

## 2015-06-01 NOTE — Progress Notes (Signed)
Social Work Patient ID: Bryan Schultz, male   DOB: Jul 20, 1955, 60 y.o.   MRN: [id number] Met with pt April-friend was not able to get her car not fixed yet. Have went ahead and made referral for bariatric rolling walker, wide bedside commode and tub bench and also home health PT, OT and SP. Pt will try to get himself a ride home if her car is not fixed by Tuesday. Will continue to work on discharge plans.

## 2015-06-01 NOTE — Progress Notes (Signed)
Speech Language Pathology Weekly Progress and Session Note  Patient Details  Name: Bryan Schultz MRN: [id number] Date of Birth: 10-Jul-1955  Beginning of progress report period: May 25, 2015  End of progress report period:  June 01, 2015  Today's Date: 06/01/2015 SLP Individual Time: 0900-1000 SLP Individual Time Calculation (min): 60 min  Short Term Goals: Week 1: SLP Short Term Goal 1 (Week 1): Patient will demonstrate problem solving for functional and familiar tasks with Min A verbal and question cues.  SLP Short Term Goal 1 - Progress (Week 1): Met SLP Short Term Goal 2 (Week 1): Patient will recall new, daily information with use of memory compensatory strategies with Mod A verbal and question cues.  SLP Short Term Goal 2 - Progress (Week 1): Met SLP Short Term Goal 3 (Week 1): Patient will demonstrate sustained attention to a functional task for 30 minutes with supervision verbal cues for redirection.  SLP Short Term Goal 3 - Progress (Week 1): Met SLP Short Term Goal 4 (Week 1): Patient will identify 2 cognitive deficits with supervision verbal cues.  SLP Short Term Goal 4 - Progress (Week 1): Met SLP Short Term Goal 5 (Week 1): Patient will attend to right field of enviornment during functional tasks with Mod A verbal and visual cues.  SLP Short Term Goal 5 - Progress (Week 1): Met    New Short Term Goals: Week 2: SLP Short Term Goal 1 (Week 2): Patient will demonstrate problem solving for functional and familiar tasks with supervision cues.  SLP Short Term Goal 2 (Week 2): Patient will recall new, daily information with use of memory compensatory strategies with Min A verbal and question cues.  SLP Short Term Goal 3 (Week 2): Patient will selectively attend to a functional task for 30 minutes with supervision verbal cues for redirection.  SLP Short Term Goal 4 (Week 2): Pt will recognize and correct errors in the moment during basic tasks wtih min assist.  SLP Short Term Goal  5 (Week 2): Patient will attend to right field of enviornment during functional tasks with Min A verbal and visual cues.   Weekly Progress Updates:  Pt made functional gains this reporting period and has met 5 out of 5 short term goals.  Pt currently requires mod assist for recall of daily information and to attend to the right of the environment, min assist for functional problem solving, and supervision for sustained attention to tasks.  Improvements are most notable for his increased intellectual awareness of his deficits.  Pt would continue to benefit from skilled ST while inpatient in order to maximize functional independence and reduce burden of care prior to discharge.  Continue to anticipate that pt will require 24/7 supervision at discharge in addition to [place] follow up at next level of care.  Pt education is ongoing.     Intensity: Minumum of 1-2 x/day, 30 to 90 minutes Frequency: 3 to 5 out of 7 days Duration/Length of Stay: 06/06/15 Treatment/Interventions: Cognitive remediation/compensation;Cueing hierarchy;Functional tasks;Patient/family education;Therapeutic Activities;Internal/external aids;Environmental controls   Daily Session  Skilled Therapeutic Interventions: Pt was seen for skilled ST targeting cognitive goals.  Pt recalled 3 rules of activity from yesterday's therapy session with min assist question cues.  Pt initially required mod assist verbal cues to plan and execute a problem solving strategy during the abovementioned task but as task progressed SLP was able to fade to min assist verbal cues due to increased task familiarity which improved working memory of task protocols and  procedures.  Pt was able to identify at least 2 deficits occurring s/p CVA and how they will impact his ability to complete familiar home management and self care tasks safely with mod assist question cues.  Pt was returned to room and left with call bell within reach, quick release belt donned.  Goals  updated on this date to reflect current progress and plan of care.       Function:   Eating Eating                 Cognition Comprehension Comprehension assist level: Understands complex 90% of the time/cues 10% of the time  Expression   Expression assist level: Expresses basic needs/ideas: With extra time/assistive device  Social Interaction Social Interaction assist level: Interacts appropriately 75 - 89% of the time - Needs redirection for appropriate language or to initiate interaction.  Problem Solving Problem solving assist level: Solves basic 50 - 74% of the time/requires cueing 25 - 49% of the time  Memory Memory assist level: Recognizes or recalls 50 - 74% of the time/requires cueing 25 - 49% of the time   General    Pain Pain Assessment Pain Assessment: 0-10 Pain Score: 6  Pain Location: Head Pain Descriptors / Indicators: Headache Pain Intervention(s): RN made aware  Therapy/Group: Individual Therapy  Kypton Eltringham, Selinda Orion 06/01/2015, 12:28 PM

## 2015-06-01 NOTE — Progress Notes (Signed)
Physical Therapy Note  Patient Details  Name: Bryan Schultz MRN: [id number] Date of Birth: 09/18/1955 Today's Date: 06/01/2015    Time: 1030-1125 55 minutes  1:1 No c/o pain. Gait throughout unit with RW with min A to close supervision, continues to require cuing to attend to Rt side to avoid obstacles.  Gait with ramp and curb negotiation multiple attempts with pt with good carryover during session, able to perform with steadying assist.  Gait in home environment on carpet with obstacle negotiation with pt with some awareness of increased difficulty of gait on carpet, close supervision/min A.  Pt seemed to have improved awareness during session, still with decreased Rt attention during all tasks.  NMR with R UE for coordination with pt requiring hand over hand assist to trace straight lines and write name.  [place] exercises performed with 2# weights B LE for LE strength and balance.   Dennisha Mouser 06/01/2015, 11:25 AM

## 2015-06-01 NOTE — Progress Notes (Signed)
Occupational Therapy Session Note  Patient Details  Name: Bryan Schultz MRN: [id number] Date of Birth: 07/03/1955  Today's Date: 06/01/2015 OT Individual Time: 1330-1400 OT Individual Time Calculation (min): 30 min    Skilled Therapeutic Interventions/Progress Updates:    1:1 therapeutic activity with focus on sit to stands, standing endurance, visual scanning and compensations for field cut, strengthening and functional use and coordination of right hand.  Engaged in playing a game on the mirror in the gym in standing position. Pt required mod A for visual scanning to a target. Pt with difficulty with eye hand coordination on a vertical surface requiring min A   Therapy Documentation Precautions:  Precautions Precautions: Fall Precaution Comments: RUE and RLE weakness and sensory deficits Restrictions Weight Bearing Restrictions: No Pain:  no c/o  ADL: ADL ADL Comments: See Functional Assessment Tool  See Function Navigator for Current Functional Status.   Therapy/Group: Individual Therapy  [name], [name] [name] 06/01/2015, 3:38 PM

## 2015-06-01 NOTE — Progress Notes (Signed)
Occupational Therapy Session Note  Patient Details  Name: Bryan Schultz MRN: [id number] Date of Birth: [birth year]  Today's Date: 06/01/2015 OT Individual Time: 0800-0900 OT Individual Time Calculation (min): 60 min    Short Term Goals: Week 1:  OT Short Term Goal 1 (Week 1): Pt will complete tub bench transfer in tub/shower with mod assist OT Short Term Goal 1 - Progress (Week 1): Not met OT Short Term Goal 2 (Week 1): Pt will complete bathing seated using AE to reach to feet and back with min assist OT Short Term Goal 2 - Progress (Week 1): Met OT Short Term Goal 3 (Week 1): Pt will demo ability to complete 3 of 4 grooming tasks using right hand as dominant with supervision OT Short Term Goal 3 - Progress (Week 1): Met OT Short Term Goal 4 (Week 1): Pt will complete toileting with mod assist for thoroughness OT Short Term Goal 4 - Progress (Week 1): Not met OT Short Term Goal 5 (Week 1): Pt will dress lower body using AE with mod assist. OT Short Term Goal 5 - Progress (Week 1): Met Week 2:  OT Short Term Goal 1 (Week 2): Pt will complete toilet hygiene sit to stand with mod assist using toilet aide. OT Short Term Goal 2 (Week 2): Pt will complete tub bench transfer in tub/shower with mod assist OT Short Term Goal 3 (Week 2): Pt will complete toilet transfer with supervision using RW and 3:1. OT Short Term Goal 4 (Week 2): Pt will ambulate to the bathroom without running into objects/environmental barriers on the right side.   OT Short Term Goal 5 (Week 2): Pt's friend will return demonstrate safe asssit with selfcare tasks.    Skilled Therapeutic Interventions/Progress Updates:    1:1 self care retraining at shower level in room. Pt's friend unable to come up today to participate in therapies due to continued car problems.  Focus on sit to stands with proper hand placement, functional ambulation with RW with anticipatory cues to navigate obstacles on right, toilet transfer, right hand  coordination, visual scanning etc. Pt able to transfer on and off the toilet with supervision with RW. However pt "sprayed" urine on the floor in front of him when voiding in seated position without awareness. Pt also attempted to use toilet aid for cleansing self after BM- however BM very sticky and required total A to clean up toilet aide and cleanse patient. Pt showered with only assistance for throughness with bathing bottom. Return to bed to dress with steadying A when standing while performing clothing management and A with donning shoes. Pt left sitting EOB with RN.   Therapy Documentation Precautions:  Precautions Precautions: Fall Precaution Comments: RUE and RLE weakness and sensory deficits Restrictions Weight Bearing Restrictions: No    Vital Signs: Therapy Vitals Temp: 98.3 F (36.8 C) Temp Source: Oral Pulse Rate: 74 Resp: 18 BP: 137/67 mmHg Patient Position (if appropriate): Lying Oxygen Therapy SpO2: 96 % O2 Device: Not Delivered Pain:  no c/o pain in session  ADL: ADL ADL Comments: See Functional Assessment Tool  See Function Navigator for Current Functional Status.   Therapy/Group: Individual Therapy  Willeen [facility] 06/01/2015, 8:16 AM

## 2015-06-01 NOTE — Progress Notes (Signed)
Subjective/Complaints: " I want to go home"  ROS- denies SOB, n/v, CP or sleep disturbance  Objective: Vital Signs: Blood pressure 137/67, pulse 74, temperature 98.3 F (36.8 C), temperature source Oral, resp. rate 18, height 6\' 1"  (1.854 m), weight 164.792 kg (363 lb 4.8 oz), SpO2 96 %. No results found. No results found for this or any previous visit (from the past 72 hour(s)).  Gen NAD. Vital signs reviewed HEENT: poor dentition. Normocephalic, atraumatic Cardio: RRR and no murmur Resp: CTA B/L and unlabored GI: BS positive and not tender Musc/Skel:  No tenderness. No Edema Neuro: Alert and Oriented   Skin: Warm and dry   Assessment/Plan: 1. 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 Physiatrist is providing close team supervision and 24 hour management of active medical problems listed below. Physiatrist and rehab team continue to assess barriers to discharge/monitor patient progress toward functional and medical goals.   FIM: Function - Bathing Position: Shower Body parts bathed by patient: Right arm, Left arm, Chest, Abdomen, Front perineal area, Buttocks, Right upper leg, Left upper leg, Right lower leg, Left lower leg Body parts bathed by helper: Back, Buttocks Bathing not applicable: Right arm, Right upper leg, Left arm, Left upper leg, Chest, Abdomen, Right lower leg, Left lower leg, Back Assist Level: Supervision or verbal cues  Function- Upper Body Dressing/Undressing What is the patient wearing?: Pull over shirt/dress Pull over shirt/dress - Perfomed by patient: Thread/unthread right sleeve, Thread/unthread left sleeve, Put head through opening, Pull shirt over trunk Assist Level: Set up Set up : To obtain clothing/put away Function  - Lower Body Dressing/Undressing What is the patient wearing?: Underwear, Pants, Non-skid slipper socks, Ted Hose Position: Wheelchair/chair at [employer] Underwear - Performed by patient: Thread/unthread right underwear leg, Thread/unthread left underwear leg, Pull underwear up/down Pants- Performed by patient: Thread/unthread right pants leg, Thread/unthread left pants leg, Pull pants up/down, Fasten/unfasten pants Pants- Performed by helper: Pull pants up/down Non-skid slipper socks- Performed by helper: Don/doff right sock, Don/doff left sock Socks - Performed by patient: Don/doff right sock, Don/doff left sock (used sockaide) Socks - Performed by helper: Don/doff right sock, Don/doff left sock TED Hose - Performed by helper: Don/doff right TED hose, Don/doff left TED hose Assist for lower body dressing: Touching or steadying assistance (Pt > 75%)  Function - Toileting Toileting activity did not occur: No continent bowel/bladder event Toileting steps completed by patient: Adjust clothing prior to toileting Toileting steps completed by helper: Performs perineal hygiene, Adjust clothing after toileting Toileting Assistive Devices: Grab bar or rail Assist level: Two helpers  Function - ArchivistToilet Transfers Toilet transfer assistive device: Elevated toilet seat/BSC over toilet Assist level to toilet: Moderate assist (Pt 50 - 74%/lift or lower) Assist level from toilet: Moderate assist (Pt 50 - 74%/lift or lower)  Function - Chair/bed transfer Chair/bed transfer method: Stand pivot Chair/bed transfer assist level: Supervision or verbal cues Chair/bed transfer assistive device: Walker Chair/bed transfer details: Visual cues/gestures for precautions/safety, Verbal cues for precautions/safety  Function - Locomotion: Wheelchair Will patient use wheelchair at discharge?: Yes Type: Manual Max wheelchair distance: 150 Assist Level: Touching or steadying assistance (Pt > 75%) Assist Level: Touching  or steadying assistance (Pt > 75%) Wheel 150 feet activity did not occur: Safety/medical concerns Assist Level: Touching or steadying assistance (Pt > 75%) Turns around,maneuvers to table,bed,  and toilet,negotiates 3% grade,maneuvers on rugs and over doorsills: No Function - Locomotion: Ambulation Assistive device: Walker-rolling Max distance: 150 Assist level: Touching or steadying assistance (Pt > 75%) Assist level: Touching or steadying assistance (Pt > 75%) Walk 50 feet with 2 turns activity did not occur: Safety/medical concerns Assist level: Touching or steadying assistance (Pt > 75%) Walk 150 feet activity did not occur: Safety/medical concerns Assist level: Touching or steadying assistance (Pt > 75%) Walk 10 feet on uneven surfaces activity did not occur: Safety/medical concerns  Function - Comprehension Comprehension: Auditory Comprehension assist level: Understands complex 90% of the time/cues 10% of the time  Function - Expression Expression: Verbal Expression assist level: Expresses basic needs/ideas: With extra time/assistive device  Function - Social Interaction Social Interaction assist level: Interacts appropriately 75 - 89% of the time - Needs redirection for appropriate language or to initiate interaction.  Function - Problem Solving Problem solving assist level: Solves basic 50 - 74% of the time/requires cueing 25 - 49% of the time  Function - Memory Memory assist level: Recognizes or recalls 50 - 74% of the time/requires cueing 25 - 49% of the time Patient normally able to recall (first 3 days only): Current season, Location of own room, That he or she is in a hospital  Medical Problem List and Plan: 1. Right side hemisensory deficits, reduced coordination and altered mental status secondary to left PCA/temporal lobe infarct- RIght homonymous hemianopsia  Discussed his deficits and how they relate to his D/C and safety issues    2. DVT  Prophylaxis/Anticoagulation: Subcutaneous Lovenox for DVT prophylaxis  3. Pain Management: Tylenol as needed 4. History of hypertension. Norvasc 5 mg daily.   Monitor with increased mobility-137/67, Norvasc increased to 10mg ,    5. Neuropsych: This patient is capable of making decisions on his own behalf. 6. Skin/Wound Care: Routine skin checks 7. Fluids/Electrolytes/Nutrition: 75-100% meals for the most part. Routine I&O normal BMET 12/27 8. Morbid obesity. Dietary consult, pt educated in low Na, heart healthy and weight loss diets 9. Hyperlipidemia. Lipitor- currently on Heart healthy diet  CVA risk factor 10. Tobacco abuse. Nicoderm patch. Provide counseling, Pt plans to stay off cigarettes after D/C LOS (Days) 11 A FACE TO FACE EVALUATION WAS PERFORMED  Zalaya Astarita E 06/01/2015, 7:05 AM

## 2015-06-02 NOTE — Progress Notes (Signed)
Subjective/Complaints: No known issues overnight  ROS- denies SOB, n/v, CP or sleep disturbance  Objective: Vital Signs: Blood pressure 144/82, pulse 88, temperature 97.5 F (36.4 C), temperature source Oral, resp. rate 18, height 6\' 1"  (1.854 m), weight 164.792 kg (363 lb 4.8 oz), SpO2 97 %. No results found. No results found for this or any previous visit (from the past 72 hour(s)).  Gen NAD. Vital signs reviewed HEENT: poor dentition. Normocephalic, atraumatic Cardio: RRR and no murmur Resp: CTA B/L and unlabored GI: BS positive and not tender Musc/Skel:  No tenderness. No Edema Neuro: Alert and Oriented   Skin: Warm and dry   Assessment/Plan: 1. 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 Physiatrist is providing close team supervision and 24 hour management of active medical problems listed below. Physiatrist and rehab team continue to assess barriers to discharge/monitor patient progress toward functional and medical goals.  Planning for discharge this coming week FIM: Function - Bathing Position: Shower Body parts bathed by patient: Right arm, Left arm, Chest, Abdomen, Front perineal area, Right upper leg, Left upper leg, Right lower leg, Left lower leg Body parts bathed by helper: Back, Buttocks Bathing not applicable: Right arm, Right upper leg, Left arm, Left upper leg, Chest, Abdomen, Right lower leg, Left lower leg, Back Assist Level: Supervision or verbal cues  Function- Upper Body Dressing/Undressing What is the patient wearing?: Pull over shirt/dress Pull over shirt/dress - Perfomed by patient: Thread/unthread right sleeve, Thread/unthread left sleeve, Put head through opening, Pull shirt over trunk Assist Level: Set up Set up : To  obtain clothing/put away Function - Lower Body Dressing/Undressing What is the patient wearing?: Underwear, Pants, [employer], Shoes Position: Sitting EOB Underwear - Performed by patient: Thread/unthread right underwear leg, Thread/unthread left underwear leg, Pull underwear up/down (brief) Pants- Performed by patient: Thread/unthread right pants leg, Thread/unthread left pants leg, Pull pants up/down, Fasten/unfasten pants Pants- Performed by helper: Pull pants up/down Non-skid slipper socks- Performed by helper: Don/doff right sock, Don/doff left sock Socks - Performed by patient: Don/doff right sock, Don/doff left sock (used sockaide) Socks - Performed by helper: Don/doff right sock, Don/doff left sock Shoes - Performed by helper: Don/doff right shoe, Don/doff left shoe TED Hose - Performed by helper: Don/doff right TED hose, Don/doff left TED hose Assist for footwear: Partial/moderate assist Assist for lower body dressing: Touching or steadying assistance (Pt > 75%)  Function - Toileting Toileting activity did not occur: No continent bowel/bladder event Toileting steps completed by patient: Adjust clothing prior to toileting Toileting steps completed by helper: Performs perineal hygiene Toileting Assistive Devices: Grab bar or rail Assist level: Touching or steadying assistance (Pt.75%)  Function - Archivist transfer assistive device: Hospital doctor level to toilet: Supervision or verbal cues Assist level from toilet: Supervision or verbal cues  Function - Chair/bed transfer Chair/bed transfer method: Stand pivot Chair/bed transfer assist level: Supervision or verbal cues Chair/bed transfer assistive device: Walker Chair/bed transfer details: Visual cues/gestures for precautions/safety, Verbal cues for precautions/safety  Function - Locomotion: Wheelchair Will patient use wheelchair at discharge?: Yes Type: Manual Max wheelchair distance: 150 Assist Level:  Touching or steadying assistance (Pt > 75%) Assist Level: Touching or steadying assistance (Pt > 75%) Wheel 150 feet activity did not occur: Safety/medical concerns Assist Level: Touching or steadying assistance (Pt > 75%) Turns  around,maneuvers to table,bed, and toilet,negotiates 3% grade,maneuvers on rugs and over doorsills: No Function - Locomotion: Ambulation Assistive device: Walker-rolling Max distance: 150 Assist level: Touching or steadying assistance (Pt > 75%) Assist level: Touching or steadying assistance (Pt > 75%) Walk 50 feet with 2 turns activity did not occur: Safety/medical concerns Assist level: Touching or steadying assistance (Pt > 75%) Walk 150 feet activity did not occur: Safety/medical concerns Assist level: Touching or steadying assistance (Pt > 75%) Walk 10 feet on uneven surfaces activity did not occur: Safety/medical concerns  Function - Comprehension Comprehension: Auditory Comprehension assist level: Understands complex 90% of the time/cues 10% of the time  Function - Expression Expression: Verbal Expression assist level: Expresses basic needs/ideas: With extra time/assistive device  Function - Social Interaction Social Interaction assist level: Interacts appropriately 75 - 89% of the time - Needs redirection for appropriate language or to initiate interaction.  Function - Problem Solving Problem solving assist level: Solves basic 50 - 74% of the time/requires cueing 25 - 49% of the time  Function - Memory Memory assist level: Recognizes or recalls 50 - 74% of the time/requires cueing 25 - 49% of the time Patient normally able to recall (first 3 days only): Current season, Location of own room, That he or she is in a hospital, Staff names and faces  Medical Problem List and Plan: 1. Right side hemisensory deficits, reduced coordination and altered mental status secondary to left PCA/temporal lobe infarct- RIght homonymous hemianopsia    2. DVT  Prophylaxis/Anticoagulation: Subcutaneous Lovenox for DVT prophylaxis  3. Pain Management: Tylenol as needed 4. History of hypertension. Norvasc 10 mg daily.   Monitor with increased mobility-144/82   5. Neuropsych: This patient is capable of making decisions on his own behalf. 6. Skin/Wound Care: Routine skin checks 7. Fluids/Electrolytes/Nutrition: 75-100% meals for the most part. Routine I&O normal BMET 12/27 8. Morbid obesity. Dietary consult, pt educated in low Na, heart healthy and weight loss diets 9. Hyperlipidemia. Lipitor- currently on Heart healthy diet  CVA risk factor 10. Tobacco abuse. Nicoderm patch. Provide counseling, Pt plans to stay off cigarettes after D/C LOS (Days) 12 A FACE TO FACE EVALUATION WAS PERFORMED  KIRSTEINS,ANDREW E 06/02/2015, 12:39 PM

## 2015-06-03 ENCOUNTER — Inpatient Hospital Stay (HOSPITAL_COMMUNITY): Payer: Medicaid Other

## 2015-06-03 MED ORDER — FUROSEMIDE 20 MG PO TABS
10.0000 mg | ORAL_TABLET | Freq: Every day | ORAL | Status: DC
Start: 2015-06-03 — End: 2015-06-05
  Administered 2015-06-03 – 2015-06-05 (×3): 10 mg via ORAL
  Filled 2015-06-03 (×3): qty 1

## 2015-06-03 NOTE — Progress Notes (Signed)
Occupational Therapy Note  Patient Details  Name: Bryan Schultz MRN: [id number] Date of Birth: 01/04/1956  Today's Date: 06/03/2015 OT Individual Time: 1400-1430 OT Individual Time Calculation (min): 30 min   Pt denied pain Individual Therapy  [facility] and visual scanning tasks with focus on controlled movements, attending to RUE/hand when engaged in functional tasks, and scanning to right to locate objects.  Pt continues to require min verbal cues for scanning strategies. Pt noted with improved attention to task and improved RUE FMC.   Bryan Schultz, Bryan Schultz [facility] 06/03/2015, 3:01 PM

## 2015-06-03 NOTE — Progress Notes (Signed)
Subjective/Complaints: According to his friend April, he has lost about 16 pounds since being in the hospital. She has noted that over the last year he's had quite a bit of swelling in his legs. He denies eating a lot of salt. We reviewed medications there are no diuretics on his regimen.  ROS- denies SOB, n/v, CP or sleep disturbance  Objective: Vital Signs: Blood pressure 145/82, pulse 80, temperature 97.8 F (36.6 C), temperature source Oral, resp. rate 17, height 6\' 1"  (1.854 m), weight 164.792 kg (363 lb 4.8 oz), SpO2 96 %. No results found. No results found for this or any previous visit (from the past 72 hour(s)).  Gen NAD. Vital signs reviewed HEENT: poor dentition. Normocephalic, atraumatic Cardio: RRR and no murmur Resp: CTA B/L and unlabored GI: BS positive and not tender Musc/Skel:  No tenderness. No Edema Neuro: Alert and Oriented   Skin: Warm and dry   Assessment/Plan: 1. 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 Physiatrist is providing close team supervision and 24 hour management of active medical problems listed below. Physiatrist and rehab team continue to assess barriers to discharge/monitor patient progress toward functional and medical goals.  Planning for discharge this coming week FIM: Function - Bathing Position: Shower Body parts bathed by patient: Right arm, Left arm, Chest, Abdomen, Front perineal area, Right upper leg, Left upper leg, Right lower leg, Left lower leg Body parts bathed by helper: Back, Buttocks Bathing not applicable: Right arm, Right upper leg, Left arm, Left upper leg, Chest, Abdomen, Right lower leg, Left lower leg, Back Assist Level: Supervision or verbal cues  Function- Upper Body  Dressing/Undressing What is the patient wearing?: Pull over shirt/dress Pull over shirt/dress - Perfomed by patient: Thread/unthread right sleeve, Thread/unthread left sleeve, Put head through opening, Pull shirt over trunk Assist Level: Set up Set up : To obtain clothing/put away Function - Lower Body Dressing/Undressing What is the patient wearing?: Underwear, Pants, American Family Insuranceed Hose, Shoes Position: Sitting EOB Underwear - Performed by patient: Thread/unthread right underwear leg, Thread/unthread left underwear leg, Pull underwear up/down (brief) Pants- Performed by patient: Thread/unthread right pants leg, Thread/unthread left pants leg, Pull pants up/down, Fasten/unfasten pants Pants- Performed by helper: Pull pants up/down Non-skid slipper socks- Performed by helper: Don/doff right sock, Don/doff left sock Socks - Performed by patient: Don/doff right sock, Don/doff left sock (used sockaide) Socks - Performed by helper: Don/doff right sock, Don/doff left sock Shoes - Performed by helper: Don/doff right shoe, Don/doff left shoe TED Hose - Performed by helper: Don/doff right TED hose, Don/doff left TED hose Assist for footwear: Partial/moderate assist Assist for lower body dressing: Touching or steadying assistance (Pt > 75%)  Function - Toileting Toileting activity did not occur: No continent bowel/bladder event Toileting steps completed by patient: Adjust clothing prior to toileting Toileting steps completed by helper: Performs perineal hygiene Toileting Assistive Devices: Grab bar or rail Assist level: Touching or steadying assistance (Pt.75%)  Function - ArchivistToilet Transfers Toilet transfer assistive device: Walker Assist level to toilet: Touching or steadying assistance (Pt > 75%) Assist level from toilet: Touching or steadying assistance (Pt > 75%)  Function - Chair/bed transfer Chair/bed transfer method: Stand pivot Chair/bed transfer assist level: Supervision or verbal cues Chair/bed  transfer assistive device: Walker Chair/bed transfer details: Visual cues/gestures for precautions/safety, Verbal cues for precautions/safety  Function -  Locomotion: Wheelchair Will patient use wheelchair at discharge?: Yes Type: Manual Max wheelchair distance: 150 Assist Level: Touching or steadying assistance (Pt > 75%) Assist Level: Touching or steadying assistance (Pt > 75%) Wheel 150 feet activity did not occur: Safety/medical concerns Assist Level: Touching or steadying assistance (Pt > 75%) Turns around,maneuvers to table,bed, and toilet,negotiates 3% grade,maneuvers on rugs and over doorsills: No Function - Locomotion: Ambulation Assistive device: Walker-rolling Max distance: 150 Assist level: Touching or steadying assistance (Pt > 75%) Assist level: Touching or steadying assistance (Pt > 75%) Walk 50 feet with 2 turns activity did not occur: Safety/medical concerns Assist level: Touching or steadying assistance (Pt > 75%) Walk 150 feet activity did not occur: Safety/medical concerns Assist level: Touching or steadying assistance (Pt > 75%) Walk 10 feet on uneven surfaces activity did not occur: Safety/medical concerns  Function - Comprehension Comprehension: Auditory Comprehension assist level: Understands complex 90% of the time/cues 10% of the time  Function - Expression Expression: Verbal Expression assist level: Expresses basic needs/ideas: With no assist  Function - Social Interaction Social Interaction assist level: Interacts appropriately 75 - 89% of the time - Needs redirection for appropriate language or to initiate interaction.  Function - Problem Solving Problem solving assist level: Solves basic 50 - 74% of the time/requires cueing 25 - 49% of the time  Function - Memory Memory assist level: Recognizes or recalls 50 - 74% of the time/requires cueing 25 - 49% of the time Patient normally able to recall (first 3 days only): Current season, Location of own  room, That he or she is in a hospital, Staff names and faces  Medical Problem List and Plan: 1. Right side hemisensory deficits, reduced coordination and altered mental status secondary to left PCA/temporal lobe infarct- RIght homonymous hemianopsia    We discussed secondary stroke prophylaxis as well as risk for a secondary stroke with the patient and his caregiver  2. DVT Prophylaxis/Anticoagulation: Subcutaneous Lovenox for DVT prophylaxis  3. Pain Management: Tylenol as needed 4. History of hypertension. Norvasc 10 mg daily.   Monitor with increased mobility-145/82, expect further decreases with the addition of Lasix   5. Neuropsych: This patient is capable of making decisions on his own behalf. 6. Skin/Wound Care: Routine skin checks 7. Fluids/Electrolytes/Nutrition: 75-100% meals for the most part. Routine I&O normal BMET 12/27 8. Morbid obesity. Dietary consult, pt educated in low Na, heart healthy and weight loss diets 9. Hyperlipidemia. Lipitor- currently on Heart healthy diet  CVA risk factor 10. Tobacco abuse. Nicoderm patch. Provide counseling, Pt plans to stay off cigarettes after D/C 11. Peripheral edema associated with morbid obesity. Will start low-dose Lasix. LOS (Days) 13 A FACE TO FACE EVALUATION WAS PERFORMED  Bryan  Schultz 06/03/2015, 11:24 AM

## 2015-06-04 ENCOUNTER — Inpatient Hospital Stay (HOSPITAL_COMMUNITY): Payer: Medicaid Other | Admitting: Speech Pathology

## 2015-06-04 ENCOUNTER — Inpatient Hospital Stay (HOSPITAL_COMMUNITY): Payer: Medicaid Other | Admitting: Occupational Therapy

## 2015-06-04 ENCOUNTER — Inpatient Hospital Stay (HOSPITAL_COMMUNITY): Payer: Medicaid Other

## 2015-06-04 LAB — CREATININE, SERUM
CREATININE: 1.15 mg/dL (ref 0.61–1.24)
GFR calc Af Amer: >60 mL/min (ref >60)
GFR calc non Af Amer: >60 mL/min (ref >60)

## 2015-06-04 MED ORDER — OXYMETAZOLINE HCL 0.05 % NA SOLN
1.0000 | Freq: Two times a day (BID) | NASAL | Status: DC
  Administered 2015-06-04 – 2015-06-05 (×2): 1 via NASAL
  Filled 2015-06-04: qty 15
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
## 2015-06-04 NOTE — Plan of Care (Signed)
Problem: RH Problem Solving Goal: LTG Patient will demonstrate problem solving for (SLP) LTG: Patient will demonstrate problem solving for basic/complex daily situations with cues (SLP)  Outcome: Not Met (add Reason) Continues to require Min-Mod cues   Problem: RH Memory Goal: LTG Patient will use memory compensatory aids to (SLP) LTG: Patient will use memory compensatory aids to recall biographical/new, daily complex information with cues (SLP)  Outcome: Not Met (add Reason) Continues to require Max assist

## 2015-06-04 NOTE — Progress Notes (Addendum)
Occupational Therapy Session Note  Patient Details  Name: Bryan Schultz MRN: [id number] Date of Birth: 05/25/1956  Today's Date: 06/04/2015 OT Individual Time: 0901-1000 OT Individual Time Calculation (min): 59 min    Short Term Goals: Week 2:  OT Short Term Goal 1 (Week 2): Pt will complete toilet hygiene sit to stand with mod assist using toilet aide. OT Short Term Goal 2 (Week 2): Pt will complete tub bench transfer in tub/shower with mod assist OT Short Term Goal 3 (Week 2): Pt will complete toilet transfer with supervision using RW and 3:1. OT Short Term Goal 4 (Week 2): Pt will ambulate to the bathroom without running into objects/environmental barriers on the right side.   OT Short Term Goal 5 (Week 2): Pt's friend will return demonstrate safe asssit with selfcare tasks.    Skilled Therapeutic Interventions/Progress Updates:    Pt's friend present for session and able to observe selfcare session.  Pt completed shower, dressing, and grooming tasks during session.  Pt overall supervision ror shower transfer with use of the RW.  He completed all bathing sitting to standing on the shower seat, except for washing his peri area.  He needed use of a LH sponge for washing the lower legs and feet.  Min instructional cueing for locating items right of midline throughout session.  Dressing completed from wheelchair level.  Pt able to use the reacher and wide sockaide for LB dressing with supervision.  Instructed pt's friend to get a gait belt from the gift shop to use for functional transfers.  Pt still with decreased short term memory issues, not being able to recall to ask MD if he will need to wear TEDs at home.    Therapy Documentation Precautions:  Precautions Precautions: Fall Precaution Comments: RUE and RLE weakness and sensory deficits Restrictions Weight Bearing Restrictions: No  Pain: Pain Assessment Pain Assessment: Faces Pain Score: 4  Faces Pain Scale: Hurts a little bit Pain  Type: Acute pain Pain Location: Head Pain Descriptors / Indicators: Headache Pain Onset: Gradual Pain Intervention(s): Repositioned;Medication (See eMAR) ADL: See Function Navigator for Current Functional Status.   Therapy/Group: Individual Therapy  Dionne Knoop OTR/L 06/04/2015, 12:32 PM

## 2015-06-04 NOTE — Discharge Instructions (Signed)
Inpatient Rehab Discharge Instructions  Bryan Schultz Discharge date and time: No discharge date for patient encounter.   Activities/Precautions/ Functional Status: Activity: activity as tolerated Diet: regular diet Wound Care: none needed Functional status:  ___ No restrictions     ___ Walk up steps independently ___ 24/7 supervision/assistance   ___ Walk up steps with assistance ___ Intermittent supervision/assistance  ___ Bathe/dress independently ___ Walk with walker     ___ Bathe/dress with assistance ___ Walk Independently    ___ Shower independently _x__ Walk with assistance    ___ Shower with assistance ___ No alcohol     ___ Return to work/school ________  Special Instructions: No driving  COMMUNITY REFERRALS UPON DISCHARGE:    Home Health:   PT, OT, SP, RN   Agency:[facility] Phone:[phone]   Date of last service:06/05/2015  Medical Equipment/Items Ordered:BARIATRIC ROLLING WALKER, WIDE BEDSIDE COMMODE AND TUB BENCH  Agency/Supplier:[facility]   [phone]   GENERAL COMMUNITY RESOURCES FOR PATIENT/FAMILY: Support Groups:[facility] SUPPORT GROUP SECOND Thursday OF EACH MONTH AT 3:00-4:00 PM CONTACT KATIE WITH QUESTIONS [phone]   STROKE/TIA DISCHARGE INSTRUCTIONS SMOKING Cigarette smoking nearly doubles your risk of having a stroke & is the single most alterable risk factor  If you smoke or have smoked in the last 12 months, you are advised to quit smoking for your health.  Most of the excess cardiovascular risk related to smoking disappears within a year of stopping.  Ask you doctor about anti-smoking medications  [place] Quit Line: 1-800-QUIT NOW  Free Smoking Cessation Classes [phone]  CHOLESTEROL Know your levels; limit fat & cholesterol in your diet  Lipid Panel     Component Value Date/Time   CHOL 148 05/19/2015 0441   TRIG 111 05/19/2015 0441   HDL 33* 05/19/2015 0441   CHOLHDL 4.5 05/19/2015 0441   VLDL 22 05/19/2015 0441   LDLCALC 93 05/19/2015 0441      Many patients benefit from treatment even if their cholesterol is at goal.  Goal: Total Cholesterol (CHOL) less than 160  Goal:  Triglycerides (TRIG) less than 150  Goal:  HDL greater than 40  Goal:  LDL (LDLCALC) less than 100   BLOOD PRESSURE American Stroke Association blood pressure target is less that 120/80 mm/Hg  Your discharge blood pressure is:  BP: (!) 132/51 mmHg  Monitor your blood pressure  Limit your salt and alcohol intake  Many individuals will require more than one medication for high blood pressure  DIABETES (A1c is a blood sugar average for last 3 months) Goal HGBA1c is under 7% (HBGA1c is blood sugar average for last 3 months)  Diabetes: No known diagnosis of diabetes    No results found for: HGBA1C   Your HGBA1c can be lowered with medications, healthy diet, and exercise.  Check your blood sugar as directed by your physician  Call your physician if you experience unexplained or low blood sugars.  PHYSICAL ACTIVITY/REHABILITATION Goal is 30 minutes at least 4 days per week  Activity: Increase activity slowly, Therapies: Physical Therapy: Home Health Return to work:   Activity decreases your risk of heart attack and stroke and makes your heart stronger.  It helps control your weight and blood pressure; helps you relax and can improve your mood.  Participate in a regular exercise program.  Talk with your doctor about the best form of exercise for you (dancing, walking, swimming, cycling).  DIET/WEIGHT Goal is to maintain a healthy weight  Your discharge diet is: Diet Heart Room  service appropriate?: Yes; Fluid consistency:: Thin  liquids Your height is:  Height: 6' (182.9 cm) Your current weight is: Weight: (!) 166.017 kg (366 lb) Your Body Mass Index (BMI) is:  BMI (Calculated): 49.7  Following the type of diet specifically designed for you will help prevent another stroke.  Your goal weight range is:    Your goal  Body Mass Index (BMI) is 19-24.  Healthy food habits can help reduce 3 risk factors for stroke:  High cholesterol, hypertension, and excess weight.  RESOURCES Stroke/Support Group:  Call [phone]   STROKE EDUCATION PROVIDED/REVIEWED AND GIVEN TO PATIENT Stroke warning signs and symptoms How to activate emergency medical system (call 911). Medications prescribed at discharge. Need for follow-up after discharge. Personal risk factors for stroke. Pneumonia vaccine given:  Flu vaccine given:  My questions have been answered, the writing is legible, and I understand these instructions.  I will adhere to these goals & educational materials that have been provided to me after my discharge from the hospital.       My questions have been answered and I understand these instructions. I will adhere to these goals and the provided educational materials after my discharge from the hospital.  Patient/Caregiver Signature _______________________________ Date __________  Clinician Signature _______________________________________ Date __________  Please bring this form and your medication list with you to all your follow-up doctor's appointments.

## 2015-06-04 NOTE — Progress Notes (Signed)
Physical Therapy Discharge Summary  Patient Details  Name: Bryan Schultz MRN: [id number] Date of Birth: 03/19/1956     Patient has met 5 of 5 long term goals due to improved activity tolerance, improved balance, improved postural control, increased strength, ability to compensate for deficits, functional use of  right upper extremity and right lower extremity, improved attention and improved awareness.  Patient to discharge at an ambulatory level Supervision.   Patient's care partner is independent to provide the necessary supervision assistance at discharge.  Reasons goals not met: n/a  Recommendation:  Patient will benefit from ongoing skilled PT services in home health setting to continue to advance safe functional mobility, address ongoing impairments in balance,strength,gait, and minimize fall risk.  Equipment: RW  Reasons for discharge: treatment goals met and discharge from hospital  Patient/family agrees with progress made and goals achieved: Yes  PT Discharge Precautions/Restrictions Precautions Precautions: Fall Precaution Comments: RUE and RLE weakness and sensory deficits Restrictions Weight Bearing Restrictions: No Vital Signs Therapy Vitals Temp: 97.4 F (36.3 C) Temp Source: Oral Pulse Rate: 83 Resp: 18 BP: 138/79 mmHg Patient Position (if appropriate): Sitting Oxygen Therapy SpO2: 95 % O2 Device: Not Delivered Pain Pain Assessment Pain Assessment: No/denies pain Vision/Perception  Vision - Assessment Ocular Range of Motion: Within Functional Limits Alignment/Gaze Preference: Within Defined Limits Tracking/Visual Pursuits: Decreased smoothness of vertical tracking;Decreased smoothness of horizontal tracking;Other (comment) (Pt exhbits jerky tracking with movements.  ) Saccades: Additional head turns occurred during testing Convergence: Impaired (comment) Praxis Praxis-Other Comments: Noted motor planning difficutlies at times with bilateral tasks.    Cognition Overall Cognitive Status: Impaired/Different from baseline Arousal/Alertness: Awake/alert Orientation Level: Oriented X4 Attention: Sustained Sustained Attention: Appears intact Memory: Impaired Memory Impairment: Decreased recall of new information;Decreased short term memory Decreased Short Term Memory: Functional basic Awareness: Impaired Awareness Impairment: Anticipatory impairment;Emergent impairment Problem Solving: Impaired Problem Solving Impairment: Functional basic Safety/Judgment: Impaired Comments: Pt with decreased short term memory as well as immediate recall during sessions.  Needs min to mod instructional cueing to remember to push up from his wheelchair or other surface he is sitting on instead of pulling up on the walker.  Mod instructional cueing to scan to the right for locating items during selfcare tasks as well as with environmental barriers during mobility.   Sensation Sensation Light Touch: Impaired Detail Light Touch Impaired Details: Impaired RLE;Impaired RUE Stereognosis: Impaired Detail Stereognosis Impaired Details: Impaired RUE Proprioception: Impaired Detail Proprioception Impaired Details: Impaired RUE;Impaired RLE Additional Comments: pt reported decreased LT in RLe, but improved since admission. Coordination Gross Motor Movements are Fluid and Coordinated: No Fine Motor Movements are Fluid and Coordinated: No Coordination and Movement Description: dysmetria R UE and LE Heel Shin Test: limited excursion bil due to body habitus, accuracy limited RLE Motor  Motor Motor: Hemiplegia Motor - Skilled Clinical Observations: R sided weakness Motor - Discharge Observations: Pt still with mild RUE coordination deficits.  Mobility Bed Mobility Bed Mobility:  (modified independent for all) Transfers Sit to Stand: 5: Supervision Sit to Stand Details: Verbal cues for safe use of DME/AE Stand to Sit: 5: Supervision Stand to Sit Details  (indicate cue type and reason): Verbal cues for safe use of DME/AE Stand to Sit Details: pt often forgets to reach back  Locomotion  Ambulation Ambulation: Yes Ambulation/Gait Assistance: 5: Supervision Ambulation Distance (Feet): 150 Feet Assistive device: Rolling walker Ambulation/Gait Assistance Details: Verbal cues for safe use of DME/AE;Verbal cues for precautions/safety (R turns, and R obstacles) Gait Gait: Yes Gait  Pattern: Impaired Gait Pattern: Decreased trunk rotation;Wide base of support;Step-through pattern;Right flexed knee in stance Stairs / Additional Locomotion Stairs: Yes Stairs Assistance: 5: Supervision Stairs Assistance Details: Verbal cues for sequencing;Verbal cues for technique;Verbal cues for precautions/safety Stair Management Technique: Two rails Number of Stairs: 12 Height of Stairs: 6 (and 3") Ramp: 4: Min assist (RW) Curb: 4: Min assist (RW) Architect: Yes Wheelchair Assistance: 5: Careers information officer: Both upper extremities Wheelchair Parts Management: Needs assistance Distance: 150  Trunk/Postural Assessment  Cervical Assessment Cervical Assessment: Within Water engineer Thoracic Assessment: Within Functional Limits Lumbar Assessment Lumbar Assessment: Within Functional Limits Postural Control Protective Responses: Pt still with increased sway to the right in standing with dynamic tasks, needs UE support for safety.   Balance Balance Balance Assessed: Yes Dynamic Sitting Balance Dynamic Sitting - Balance Support: No upper extremity supported Dynamic Sitting - Level of Assistance: 5: Stand by assistance Static Standing Balance Static Standing - Balance Support: During functional activity Static Standing - Level of Assistance: 5: Stand by assistance Dynamic Standing Balance Dynamic Standing - Balance Support: Right upper extremity supported;Left upper extremity  supported;During functional activity Dynamic Standing - Level of Assistance: 5: Stand by assistance Extremity Assessment  RUE Assessment RUE Assessment: Exceptions to [facility] RUE Strength RUE Overall Strength: Within Functional Limits for tasks performed (Pt with good functional strength but demonstrates dysmetria and decreased gross and FM coordination during funcitonal use.  Decreased ability to complete any pegs during 9 hole peg test or perform palm to fingertip translation.  ) LUE Assessment LUE Assessment: Within Functional Limits RLE Assessment RLE Assessment: Exceptions to [facility] (grossly 3-/5) RLE Strength RLE Overall Strength: Deficits RLE Overall Strength Comments: grossly in sitting: 4+/5 except ankle DF 4/5 LLE Assessment LLE Assessment: Within Functional Limits   See Function Navigator for Current Functional Status.  COOK,CAROLINE 06/04/2015, 4:58 PM

## 2015-06-04 NOTE — Progress Notes (Signed)
Physical Therapy Session Note  Patient Details  Name: Bryan Schultz MRN: [id number] Date of Birth: 08/29/1955  Today's Date: 06/04/2015 PT Individual Time: 1303-1405 PT Individual Time Calculation (min): 62 min   Short Term Goals: Week 2:  PT Short Term Goal 1 (Week 2): pt will perform basic transfers consistently with superviison, using RW PRN PT Short Term Goal 2 (Week 2): pt will demonstrate improved R attention by propelling w/c throughout obstacle course x 50' without encountering obstacles on R 75% ot attempts. PT Short Term Goal 3 (Week 2): pt will perform bil UE task at counter PT Short Term Goal 4 (Week 2): pt will perform gait x 150' with L andf R turns, with superviision , without LOB during turns or R knee buckling PT Short Term Goal 5 (Week 2): pt will ascend/descend 4 6' steps 2 rails with supervision  Skilled Therapeutic Interventions/Progress Updates: family ed with friend April for simulated sedan ht seat car transfers, folding RW, basic transfer, gait on level tile and carpet, gait in home setting, up/down curb and ramp.  She observed pt on stairs with PT, and moving on regular bed modified independent.  Pt needed cues for hand placement sit>< stand with RW >50% of time. R knee did not buckle throughout session.  Pt to be seen tomorrow for outcome measures, and functional tools of retrieving item from floor and gait over uneven terrain.  Pt propelled w/c in busy setting with hemi method, min cues for obstacles and tight doorways due to R inattention, especially to R hand.   Pt returned to room and left with all needs within reach, April present.  She is aware she needs to don quick release belt if she leaves room.    Therapy Documentation Precautions:  Precautions Precautions: Fall Precaution Comments: RUE and RLE weakness and sensory deficits Restrictions Weight Bearing Restrictions: No   Pain: Pain Assessment Pain Assessment: No/denies pain    See Function Navigator  for Current Functional Status.   Therapy/Group: Individual Therapy  Kabella Cassidy 06/04/2015, 4:45 PM

## 2015-06-04 NOTE — Progress Notes (Signed)
Speech Language Pathology Discharge Summary  Patient Details  Name: Bryan Schultz MRN: [id number] Date of Birth: 1955/09/17  Today's Date: 06/04/2015 SLP Individual Time: 1032-1132 SLP Individual Time Calculation (min): 60 min   Skilled Therapeutic Interventions:  Skilled treatment session focused on addressing cognitive goals and wrap up of education. SLP facilitated session by administering the MoCA 7.3. Patient scored 10/30 points with a score of 26 or above considered normal. SLP utilized errors to demonstrate deficits to caregiver and provided home management strategies for memory deficits.  Patient and caregiver verbalized understanding of information.    Patient has met 2 of 4 long term goals.  Patient to discharge at overall Supervision;Mod;Max level.  Reasons goals not met:  Due to severity of deficits and recall impairment that limited patient's ability to make progress.  Clinical Impression/Discharge Summary: Patient has made functional gains during this rehab admission and has met 2 out of 4 long term goals due to improved cognitive abilities.  MoCA blind administered upon admission with a score of 12/22 and upon discharge MoCA 7.3 administered with a score of 10/30.  Score difference is a result of severity of visual perceptual deficits that persist. Patient is currently an overall Supervision-Max assist for basic cognitive tasks and requires Max assist for utilization of memory compensatory strategies. Patient and family education has been completed and patient will discharge home with 24 hour supervision. Patient would benefit from follow up SLP services to continue efforts to maximize cognitive skills and to maximize his functional independence and further reduce the burden of care.   Care Partner:  Caregiver Able to Provide Assistance: Yes  Type of Caregiver Assistance: Physical;Cognitive  Recommendation:  24 hour supervision/assistance;Home Health SLP;Outpatient SLP  Rationale for  SLP Follow Up: Maximize cognitive function and independence;Reduce caregiver burden   Equipment: none   Reasons for discharge: Discharged from hospital   Patient/Family Agrees with Progress Made and Goals Achieved: Yes   Function:  Cognition Comprehension Comprehension assist level: Understands complex 90% of the time/cues 10% of the time  Expression   Expression assist level: Expresses basic needs/ideas: With no assist  Social Interaction Social Interaction assist level: Interacts appropriately 90% of the time - Needs monitoring or encouragement for participation or interaction.  Problem Solving Problem solving assist level: Solves basic 50 - 74% of the time/requires cueing 25 - 49% of the time  Memory Memory assist level: Recognizes or recalls 25 - 49% of the time/requires cueing 50 - 75% of the time   Carmelia Roller., CCC-SLP [place] 06/04/2015, 6:57 PM
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
## 2015-06-04 NOTE — Progress Notes (Signed)
Social Work Patient ID: Bryan Schultz, male   DOB: [birth year], 59 y.o.   MRN: [id number] Met with pt and April-friend here to learn pt's care needs. Both feel comfortable with his care and April has done very well. Discussed need for a wheelchair, will wait for now and see how he does at home since he is ambulating household distances and wheelchair Would not be accessible for April's apartment. Aware can Designer, industrial/product and can get one if needed once gets home and see's how he is ambulating there.

## 2015-06-04 NOTE — Progress Notes (Signed)
Subjective/Complaints: Friend is getting trained for upcoming discharge  ROS- denies SOB, n/v, CP or sleep disturbance  Objective: Vital Signs: Blood pressure 146/82, pulse 81, temperature 98.3 F (36.8 C), temperature source Oral, resp. rate 18, height 6\' 1"  (1.854 m), weight 164.792 kg (363 lb 4.8 oz), SpO2 94 %. No results found. No results found for this or any previous visit (from the past 72 hour(s)).  Gen NAD. Vital signs reviewed HEENT: poor dentition. Normocephalic, atraumatic Cardio: RRR and no murmur Resp: CTA B/L and unlabored GI: BS positive and not tender Musc/Skel:  No tenderness. No Edema Neuro: Alert and Oriented   Skin: Warm and dry   Assessment/Plan: 1. 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 Physiatrist is providing close team supervision and 24 hour management of active medical problems listed below. Physiatrist and rehab team continue to assess barriers to discharge/monitor patient progress toward functional and medical goals. Plan discharge in a.m. FIM: Function - Bathing Position: Shower Body parts bathed by patient: Right arm, Left arm, Chest, Abdomen, Front perineal area, Right upper leg, Left upper leg, Right lower leg, Left lower leg Body parts bathed by helper: Back, Buttocks Bathing not applicable: Right arm, Right upper leg, Left arm, Left upper leg, Chest, Abdomen, Right lower leg, Left lower leg, Back Assist Level: Supervision or verbal cues  Function- Upper Body Dressing/Undressing What is the patient wearing?: Pull over shirt/dress Pull over shirt/dress - Perfomed by patient: Thread/unthread right sleeve, Thread/unthread left sleeve, Put head through opening, Pull shirt over trunk Assist Level: Set up Set up :  To obtain clothing/put away Function - Lower Body Dressing/Undressing What is the patient wearing?: Underwear, Pants, [employer], Shoes Position: Sitting EOB Underwear - Performed by patient: Thread/unthread right underwear leg, Thread/unthread left underwear leg, Pull underwear up/down (brief) Pants- Performed by patient: Thread/unthread right pants leg, Thread/unthread left pants leg, Pull pants up/down, Fasten/unfasten pants Pants- Performed by helper: Pull pants up/down Non-skid slipper socks- Performed by helper: Don/doff right sock, Don/doff left sock Socks - Performed by patient: Don/doff right sock, Don/doff left sock (used sockaide) Socks - Performed by helper: Don/doff right sock, Don/doff left sock Shoes - Performed by helper: Don/doff right shoe, Don/doff left shoe TED Hose - Performed by helper: Don/doff right TED hose, Don/doff left TED hose Assist for footwear: Partial/moderate assist Assist for lower body dressing: Touching or steadying assistance (Pt > 75%)  Function - Toileting Toileting activity did not occur: No continent bowel/bladder event Toileting steps completed by patient: Adjust clothing prior to toileting, Performs perineal hygiene, Adjust clothing after toileting Toileting steps completed by helper: Performs perineal hygiene Toileting Assistive Devices: Grab bar or rail Assist level: Touching or steadying assistance (Pt.75%)  Function - Archivist transfer activity did not occur: N/A Toilet transfer assistive device: Walker Assist level to toilet: Touching or steadying assistance (Pt > 75%) Assist level from toilet: Touching or steadying assistance (Pt > 75%)  Function - Chair/bed transfer Chair/bed transfer method: Stand pivot Chair/bed transfer assist level: Supervision or verbal cues Chair/bed transfer assistive device: Walker Chair/bed transfer details: Visual cues/gestures for precautions/safety, Verbal cues for  precautions/safety  Function - Locomotion: Wheelchair Will patient use wheelchair at discharge?: Yes Type: Manual Max wheelchair distance: 150 Assist Level: Touching or steadying assistance (Pt > 75%) Assist Level: Touching or steadying assistance (Pt >  75%) Wheel 150 feet activity did not occur: Safety/medical concerns Assist Level: Touching or steadying assistance (Pt > 75%) Turns around,maneuvers to table,bed, and toilet,negotiates 3% grade,maneuvers on rugs and over doorsills: No Function - Locomotion: Ambulation Assistive device: Walker-rolling Max distance: 150 Assist level: Touching or steadying assistance (Pt > 75%) Assist level: Touching or steadying assistance (Pt > 75%) Walk 50 feet with 2 turns activity did not occur: Safety/medical concerns Assist level: Touching or steadying assistance (Pt > 75%) Walk 150 feet activity did not occur: Safety/medical concerns Assist level: Touching or steadying assistance (Pt > 75%) Walk 10 feet on uneven surfaces activity did not occur: Safety/medical concerns  Function - Comprehension Comprehension: Auditory Comprehension assist level: Understands complex 90% of the time/cues 10% of the time  Function - Expression Expression: Verbal Expression assist level: Expresses basic needs/ideas: With extra time/assistive device  Function - Social Interaction Social Interaction assist level: Interacts appropriately 75 - 89% of the time - Needs redirection for appropriate language or to initiate interaction.  Function - Problem Solving Problem solving assist level: Solves basic 50 - 74% of the time/requires cueing 25 - 49% of the time  Function - Memory Memory assist level: Recognizes or recalls 75 - 89% of the time/requires cueing 10 - 24% of the time Patient normally able to recall (first 3 days only): Current season, Location of own room, That he or she is in a hospital, Staff names and faces  Medical Problem List and Plan: 1. Right  side hemisensory deficits, reduced coordination and altered mental status secondary to left PCA/temporal lobe infarct- RIght homonymous hemianopsia     2. DVT Prophylaxis/Anticoagulation: Subcutaneous Lovenox for DVT prophylaxis  3. Pain Management: Tylenol as needed 4. History of hypertension. Norvasc 10 mg daily.   Monitor with increased mobility-146/82, expect further decreases with the addition of Lasix   5. Neuropsych: This patient is capable of making decisions on his own behalf. 6. Skin/Wound Care: Routine skin checks 7. Fluids/Electrolytes/Nutrition: 75-100% meals for the most part. Routine I&O normal BMET 12/27 8. Morbid obesity. Dietary consult, pt educated in low Na, heart healthy and weight loss diets 9. Hyperlipidemia. Lipitor- currently on Heart healthy diet  CVA risk factor 10. Tobacco abuse. Nicoderm patch. Provide counseling, Pt plans to stay off cigarettes after D/C 11. Peripheral edema associated with morbid obesity. Will start low-dose Lasix. LOS (Days) 14 A FACE TO FACE EVALUATION WAS PERFORMED  Bryan Schultz E 06/04/2015, 9:53 AM

## 2015-06-04 NOTE — Progress Notes (Signed)
Occupational Therapy Discharge Summary  Patient Details  Name: Bryan Schultz MRN: [id number] Date of Birth: April 13, 1956  Today's Date: 06/04/2015 OT Individual Time: 1431-1500 OT Individual Time Calculation (min): 29 min     Session Note:  Educated pt and friend on FM coordination exercises for the RUE.  Also provided handout for reference as well.  Pt able to return demonstrate performance of 2-3 exercises with focus on in-hand manipulation skills and opposition, which pt still demonstrates moderate difficulty with.   Patient has met 8 of 8 long term goals due to improved balance, postural control, ability to compensate for deficits and functional use of  RIGHT upper extremity.  Patient to discharge at overall Supervision level.  Patient's care partner is independent to provide the necessary physical and cognitive assistance at discharge.    Reasons goals not met: NA  Recommendation:  Patient will benefit from ongoing skilled OT services in home health setting to continue to advance functional skills in the area of BADL.  Pt still demonstrates decreased balance requiring the use of a RW for mobility as well as a tub bench and 3:1 for performance of toileting and bathing.  AE is also being utilized for LB selfcare secondary to demonstrating decreased AROM in the RLE for dressing tasks. RUE function is still impaired with FM and gross motor function as is his peripheral vision.  Pt still needs cueing to compensate for his vision loss as well as daily cueing for sequencing transfers and hand placement during sit to stand.  Feel he needs initial 24 hour supervision for safety with HHOT recommended.    Equipment: tub bench, wide 3:1  Reasons for discharge: treatment goals met and discharge from hospital  Patient/family agrees with progress made and goals achieved: Yes  OT Discharge Precautions/Restrictions  Precautions Precautions: Fall Precaution Comments: RUE and RLE weakness and sensory  deficits Restrictions Weight Bearing Restrictions: No  Pain Pain Assessment Pain Assessment: No/denies pain ADL ADL ADL Comments: See Functional Assessment Tool Vision/Perception  Vision- History Patient Visual Report: Blurring of vision;Other (comment);Peripheral vision impairment Vision- Assessment Vision Assessment?: Yes Ocular Range of Motion: Within Functional Limits Alignment/Gaze Preference: Within Defined Limits Tracking/Visual Pursuits: Decreased smoothness of vertical tracking;Decreased smoothness of horizontal tracking;Other (comment) (Pt exhbits jerky tracking with movements.  ) Saccades: Additional head turns occurred during testing Convergence: Impaired (comment) Visual Fields: Right homonymous hemianopsia Praxis Praxis-Other Comments: Noted motor planning difficutlies at times with bilateral tasks.   Cognition Overall Cognitive Status: Impaired/Different from baseline Arousal/Alertness: Awake/alert Orientation Level: Oriented X4 Attention: Sustained Sustained Attention: Appears intact Memory: Impaired Memory Impairment: Decreased recall of new information;Decreased short term memory Decreased Short Term Memory: Functional basic Awareness: Impaired Awareness Impairment: Anticipatory impairment;Emergent impairment Problem Solving: Impaired Problem Solving Impairment: Functional basic Safety/Judgment: Impaired Comments: Pt with decreased short term memory as well as immediate recall during sessions.  Needs min to mod instructional cueing to remember to push up from his wheelchair or other surface he is sitting on instead of pulling up on the walker.  Mod instructional cueing to scan to the right for locating items during selfcare tasks as well as with environmental barriers during mobility.   Sensation Sensation Light Touch: Impaired Detail Light Touch Impaired Details: Impaired RUE (decreased light touch in the right digits including thumb ) Stereognosis:  Impaired Detail Stereognosis Impaired Details: Impaired RUE Proprioception: Appears Intact Additional Comments: Light touch is intact in the RUE but is more dull compared to the LUE.  Pt only able to detect  1/4 objects with stereognosis in the right hand.   Coordination Gross Motor Movements are Fluid and Coordinated: No Fine Motor Movements are Fluid and Coordinated: No Coordination and Movement Description: Pt with slower movement finger to nose using the RUE as well as not being able to hit target accurately.  Pt still with decreased FM control and coordination for manipulation of small objects with the right hand as well.  He is able to oppose his thumb to all digits except the 5th.  Motor  Motor Motor: Hemiplegia Motor - Discharge Observations: Pt still with mild RUE coordination deficits. Mobility  Transfers Transfers: Sit to Stand;Stand to Sit Sit to Stand: 5: Supervision;From chair/3-in-1;With armrests;With upper extremity assist Sit to Stand Details: Verbal cues for technique;Verbal cues for precautions/safety Stand to Sit: 5: Supervision;With armrests;With upper extremity assist;To chair/3-in-1 Stand to Sit Details (indicate cue type and reason): Visual cues for safe use of DME/AE;Verbal cues for precautions/safety  Trunk/Postural Assessment  Cervical Assessment Cervical Assessment: Within Functional Limits Thoracic Assessment Thoracic Assessment: Within Functional Limits Lumbar Assessment Lumbar Assessment: Within Functional Limits Postural Control Protective Responses: Pt still with increased sway to the right in standing with dynamic tasks, needs UE support for safety.   Balance Balance Balance Assessed: Yes Dynamic Sitting Balance Dynamic Sitting - Balance Support: No upper extremity supported Dynamic Sitting - Level of Assistance: 5: Stand by assistance Static Standing Balance Static Standing - Balance Support: During functional activity Static Standing - Level of  Assistance: 5: Stand by assistance Dynamic Standing Balance Dynamic Standing - Balance Support: Right upper extremity supported;Left upper extremity supported;During functional activity Dynamic Standing - Level of Assistance: 5: Stand by assistance Extremity/Trunk Assessment RUE Assessment RUE Assessment: Exceptions to [facility] RUE Strength RUE Overall Strength: Within Functional Limits for tasks performed (Pt with good functional strength but demonstrates dysmetria and decreased gross and FM coordination during funcitonal use.  Decreased ability to complete any pegs during 9 hole peg test or perform palm to fingertip translation.  ) LUE Assessment LUE Assessment: Within Functional Limits   See Function Navigator for Current Functional Status.  Pachia Strum OTR/L 06/04/2015, 4:51 PM

## 2015-06-04 NOTE — Discharge Summary (Signed)
Discharge summary job [id number]

## 2015-06-05 ENCOUNTER — Inpatient Hospital Stay (HOSPITAL_COMMUNITY): Payer: Medicaid Other | Admitting: Physical Therapy

## 2015-06-05 DIAGNOSIS — J0110 Acute frontal sinusitis, unspecified: Secondary | ICD-10-CM

## 2015-06-05 MED ORDER — ATORVASTATIN CALCIUM 40 MG PO TABS: 40.0000 mg | ORAL_TABLET | Freq: Every day | ORAL | Status: AC

## 2015-06-05 MED ORDER — FUROSEMIDE 20 MG PO TABS: 10.0000 mg | ORAL_TABLET | Freq: Every day | ORAL | Status: AC

## 2015-06-05 MED ORDER — NICOTINE 21 MG/24HR TD PT24: MEDICATED_PATCH | TRANSDERMAL | Status: DC

## 2015-06-05 MED ORDER — AMOXICILLIN 500 MG PO CAPS: 500.0000 mg | ORAL_CAPSULE | Freq: Two times a day (BID) | ORAL | Status: DC

## 2015-06-05 MED ORDER — AMLODIPINE BESYLATE 5 MG PO TABS: 5.0000 mg | ORAL_TABLET | Freq: Every day | ORAL | Status: AC
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
## 2015-06-05 NOTE — Progress Notes (Signed)
Subjective/Complaints: Left forehead pain and nasal drainage.  ROS- denies SOB, n/v, CP or sleep disturbance  Objective: Vital Signs: Blood pressure 145/84, pulse 102, temperature 97.7 F (36.5 C), temperature source Oral, resp. rate 18, height 6' 1"  (1.854 m), weight 164.792 kg (363 lb 4.8 oz), SpO2 97 %. No results found. Results for orders placed or performed during the hospital encounter of 05/21/15 (from the past 72 hour(s))  Creatinine, serum     Status: None   Collection Time: 06/04/15  9:10 AM  Result Value Ref Range   Creatinine, Ser 1.15 0.61 - 1.24 mg/dL   GFR calc non Af Amer >60 >60 mL/min   GFR calc Af Amer >60 >60 mL/min    Comment: (NOTE) The eGFR has been calculated using the CKD EPI equation. This calculation has not been validated in all clinical situations. eGFR's persistently <60 mL/min signify possible Chronic Kidney Disease.     Gen NAD. Vital signs reviewed HEENT: poor dentition. Normocephalic, atraumatic Cardio: RRR and no murmur Resp: CTA B/L and unlabored GI: BS positive and not tender Musc/Skel:  No tenderness. No Edema Neuro: Alert and Oriented   Skin: Warm and dry   Assessment/Plan: 1. 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, right homonymous hemianopsia, right hemisensory and cognitive deficits  Stable for D/C today F/u PCP in 1-2 weeks F/u PM&R 3 weeks See D/C summary See D/C instructions FIM: Function - Bathing Position: Shower Body parts bathed by patient: Right arm, Left arm, Chest, Abdomen, Front perineal area, Right upper leg, Left upper leg, Right lower leg, Left lower leg Body parts bathed by helper: Back, Buttocks Bathing not applicable: Right arm, Right upper leg, Left arm, Left upper leg, Chest, Abdomen, Right lower leg, Left lower leg, Back Assist Level: Supervision or verbal cues  Function- Upper Body Dressing/Undressing What is the patient wearing?: Pull over  shirt/dress Pull over shirt/dress - Perfomed by patient: Thread/unthread right sleeve, Thread/unthread left sleeve, Put head through opening, Pull shirt over trunk Assist Level: Set up Set up : To obtain clothing/put away Function - Lower Body Dressing/Undressing What is the patient wearing?: Underwear, Pants, [employer], Shoes, Non-skid slipper socks Position: Wheelchair/chair at [employer] - Performed by patient: Thread/unthread right underwear leg, Thread/unthread left underwear leg, Pull underwear up/down Pants- Performed by patient: Thread/unthread right pants leg, Thread/unthread left pants leg, Pull pants up/down Pants- Performed by helper: Pull pants up/down Non-skid slipper socks- Performed by patient: Don/doff right sock, Don/doff left sock Non-skid slipper socks- Performed by helper: Don/doff right sock, Don/doff left sock Socks - Performed by patient: Don/doff right sock, Don/doff left sock (used sockaide) Socks - Performed by helper: Don/doff right sock, Don/doff left sock Shoes - Performed by helper: Don/doff right shoe, Don/doff left shoe TED Hose - Performed by helper: Don/doff right TED hose, Don/doff left TED hose Assist for footwear: Partial/moderate assist Assist for lower body dressing: Touching or steadying assistance (Pt > 75%)  Function - Toileting Toileting activity did not occur: No continent bowel/bladder event Toileting steps completed by patient: Adjust clothing prior to toileting, Adjust clothing after toileting Toileting steps completed by helper: Performs perineal hygiene Toileting Assistive Devices: Grab bar or rail Assist level: Touching or steadying assistance (Pt.75%)  Function - Air cabin crew transfer activity did not occur: N/A Toilet transfer assistive device: Walker Assist level to toilet: Supervision or verbal cues Assist level from toilet: Supervision or verbal cues  Function - Chair/bed transfer Chair/bed transfer method: Stand  pivot Chair/bed transfer assist level: Supervision or verbal cues Chair/bed transfer assistive device: Walker Chair/bed transfer details: Visual cues/gestures for precautions/safety, Verbal cues for precautions/safety, Verbal cues for safe use of DME/AE  Function - Locomotion: Wheelchair Will patient use wheelchair at discharge?: No Type: Manual Max wheelchair distance: 150 Assist Level: Supervision or verbal cues Assist Level: Supervision or verbal cues Wheel 150 feet activity did not occur: Safety/medical concerns Assist Level: Supervision or verbal cues Turns around,maneuvers to table,bed, and toilet,negotiates 3% grade,maneuvers on rugs and over doorsills: No Function - Locomotion: Ambulation Assistive device: Walker-rolling Max distance: 150 Assist level: Supervision or verbal cues Assist level: Supervision or verbal cues Walk 50 feet with 2 turns activity did not occur: Safety/medical concerns Assist level: Supervision or verbal cues Walk 150 feet activity did not occur: Safety/medical concerns Assist level: Supervision or verbal cues Walk 10 feet on uneven surfaces activity did not occur: Safety/medical concerns  Function - Comprehension Comprehension: Auditory Comprehension assist level: Understands complex 90% of the time/cues 10% of the time  Function - Expression Expression: Verbal Expression assist level: Expresses basic needs/ideas: With no assist  Function - Social Interaction Social Interaction assist level: Interacts appropriately with others - No medications needed.  Function - Problem Solving Problem solving assist level: Solves basic 75 - 89% of the time/requires cueing 10 - 24% of the time  Function - Memory Memory assist level: Recognizes or recalls 50 - 74% of the time/requires cueing 25 - 49% of the time Patient normally able to recall (first 3 days only): Current season, Location of own room, Staff names and faces, That he or she is in a  hospital  Medical Problem List and Plan: 1. Right side hemisensory deficits, reduced coordination and altered mental status secondary to left PCA/temporal lobe infarct- RIght homonymous hemianopsia     2. DVT Prophylaxis/Anticoagulation: Subcutaneous Lovenox for DVT prophylaxis  3. Pain Management: Tylenol as needed 4. History of hypertension. Norvasc 10 mg daily. Lasix 77m /d    5. Neuropsych: This patient is capable of making decisions on his own behalf. 6. Skin/Wound Care: Routine skin checks 7. Fluids/Electrolytes/Nutrition: 75-100% meals for the most part. Routine I&O normal BMET 12/27 8. Morbid obesity. Dietary consult, pt educated in low Na, heart healthy and weight loss diets 9. Hyperlipidemia. Lipitor- currently on Heart healthy diet  CVA risk factor 10. Tobacco abuse. Nicoderm patch. Provide counseling, Pt plans to stay off cigarettes after D/C 11. Sinusitis causing headache, start Amoxil 500 twice a day, two-week course LOS (Days) 15 A FACE TO FACE EVALUATION WAS PERFORMED  KIRSTEINS,ANDREW E 06/05/2015, 8:47 AM

## 2015-06-05 NOTE — Progress Notes (Signed)
Discharge instructions given to pt and caregiver by Deatra Inaan Angiulli, PA with verbal understanding. Pt discharged at 1000 to home with caregiver.

## 2015-06-05 NOTE — Progress Notes (Signed)
Physical Therapy Session Note  Patient Details  Name: Bryan Schultz MRN: [id number] Date of Birth: 06/18/1955  Today's Date: 06/05/2015 PT Individual Time: 0930-0955 PT Individual Time Calculation (min): 25 min    Skilled Therapeutic Interventions/Progress Updates:    pt's friend present for treatment and provided appropriate supervision during gait to/from gym.  Pt re-tested on BERG balance test and improved score from 14/56 to 35/56. Pt/friend educated that pt is still a high fall risk, but has improved, both express understanding.  Therapy Documentation Precautions:  Precautions Precautions: Fall Precaution Comments: RUE and RLE weakness and sensory deficits Restrictions Weight Bearing Restrictions: No  Pain: No c/o pain  Balance: Berg Balance Test Sit to Stand: Able to stand without using hands and stabilize independently Standing Unsupported: Able to stand safely 2 minutes Sitting with Back Unsupported but Feet Supported on Floor or Stool: Able to sit safely and securely 2 minutes Stand to Sit: Controls descent by using hands Transfers: Able to transfer safely, definite need of hands Standing Unsupported with Eyes Closed: Able to stand 10 seconds with supervision Standing Ubsupported with Feet Together: Needs help to attain position but able to stand for 30 seconds with feet together From Standing, Reach Forward with Outstretched Arm: Can reach forward >12 cm safely (5") From Standing Position, Pick up Object from Floor: Able to pick up shoe, needs supervision From Standing Position, Turn to Look Behind Over each Shoulder: Looks behind one side only/other side shows less weight shift Turn 360 Degrees: Needs close supervision or verbal cueing Standing Unsupported, Alternately Place Feet on Step/Stool: Able to complete >2 steps/needs minimal assist Standing Unsupported, One Foot in Front: Able to take small step independently and hold 30 seconds Standing on One Leg: Unable to  try or needs assist to prevent fall Total Score: 35  See Function Navigator for Current Functional Status.   Therapy/Group: Individual Therapy  DONAWERTH,KAREN 06/05/2015, 9:55 AM

## 2015-06-14 ENCOUNTER — Encounter: Payer: Self-pay | Admitting: Physical Medicine & Rehabilitation

## 2015-06-18 DIAGNOSIS — I10 Essential (primary) hypertension: Secondary | ICD-10-CM | POA: Diagnosis not present

## 2015-06-18 DIAGNOSIS — I69318 Other symptoms and signs involving cognitive functions following cerebral infarction: Secondary | ICD-10-CM | POA: Diagnosis not present

## 2015-06-18 DIAGNOSIS — I69351 Hemiplegia and hemiparesis following cerebral infarction affecting right dominant side: Secondary | ICD-10-CM | POA: Diagnosis not present

## 2015-06-18 DIAGNOSIS — Z720 Tobacco use: Secondary | ICD-10-CM

## 2015-07-03 ENCOUNTER — Ambulatory Visit (HOSPITAL_BASED_OUTPATIENT_CLINIC_OR_DEPARTMENT_OTHER): Payer: Medicaid Other | Admitting: Physical Medicine & Rehabilitation

## 2015-07-03 ENCOUNTER — Encounter: Payer: Medicaid Other | Attending: Physical Medicine & Rehabilitation

## 2015-07-03 ENCOUNTER — Encounter: Payer: Self-pay | Admitting: Physical Medicine & Rehabilitation

## 2015-07-03 VITALS — BP 147/68 | HR 75 | Resp 14

## 2015-07-03 DIAGNOSIS — F17210 Nicotine dependence, cigarettes, uncomplicated: Secondary | ICD-10-CM | POA: Insufficient documentation

## 2015-07-03 DIAGNOSIS — I69398 Other sequelae of cerebral infarction: Secondary | ICD-10-CM | POA: Diagnosis not present

## 2015-07-03 DIAGNOSIS — H53461 Homonymous bilateral field defects, right side: Secondary | ICD-10-CM

## 2015-07-03 DIAGNOSIS — I63532 Cerebral infarction due to unspecified occlusion or stenosis of left posterior cerebral artery: Secondary | ICD-10-CM

## 2015-07-03 DIAGNOSIS — I69319 Unspecified symptoms and signs involving cognitive functions following cerebral infarction: Secondary | ICD-10-CM | POA: Diagnosis not present

## 2015-07-03 DIAGNOSIS — I10 Essential (primary) hypertension: Secondary | ICD-10-CM | POA: Insufficient documentation

## 2015-07-03 DIAGNOSIS — R269 Unspecified abnormalities of gait and mobility: Secondary | ICD-10-CM | POA: Diagnosis not present

## 2015-07-03 DIAGNOSIS — I69393 Ataxia following cerebral infarction: Secondary | ICD-10-CM | POA: Diagnosis not present

## 2015-07-03 DIAGNOSIS — E669 Obesity, unspecified: Secondary | ICD-10-CM | POA: Insufficient documentation

## 2015-07-03 NOTE — Patient Instructions (Addendum)
Therapy orders for PT OT and speech at [facility] were written today. They should contact you to set up an appointment.  [facility] in [place], Dr. Is Dr. Lavena Stanford may call the office to make an appointment. You will have to contact his prior physician to get records sent
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
## 2015-07-13 ENCOUNTER — Encounter (HOSPITAL_COMMUNITY): Payer: Self-pay | Admitting: Specialist

## 2015-07-13 ENCOUNTER — Ambulatory Visit (HOSPITAL_COMMUNITY): Payer: Medicaid Other | Attending: Physical Medicine & Rehabilitation | Admitting: Specialist

## 2015-07-13 DIAGNOSIS — R29898 Other symptoms and signs involving the musculoskeletal system: Secondary | ICD-10-CM | POA: Insufficient documentation

## 2015-07-13 DIAGNOSIS — I63532 Cerebral infarction due to unspecified occlusion or stenosis of left posterior cerebral artery: Secondary | ICD-10-CM | POA: Insufficient documentation

## 2015-07-13 DIAGNOSIS — R278 Other lack of coordination: Secondary | ICD-10-CM

## 2015-07-13 DIAGNOSIS — I69998 Other sequelae following unspecified cerebrovascular disease: Secondary | ICD-10-CM | POA: Diagnosis present

## 2015-07-13 DIAGNOSIS — R279 Unspecified lack of coordination: Secondary | ICD-10-CM | POA: Insufficient documentation

## 2015-07-13 DIAGNOSIS — H53469 Homonymous bilateral field defects, unspecified side: Secondary | ICD-10-CM | POA: Insufficient documentation

## 2015-07-13 DIAGNOSIS — I69398 Other sequelae of cerebral infarction: Secondary | ICD-10-CM

## 2015-07-13 NOTE — Therapy (Signed)
[facility] [address] [place], [place], [address] Phone: [phone]   Fax:  [phone]  Occupational Therapy Evaluation  Patient Details  Name: Bryan Schultz MRN: [id number] Date of Birth: 1955/08/17 Referring Provider: Dr. Claudette Schultz   Encounter Date: 07/13/2015      OT End of Session - 07/13/15 1831    Visit Number 1   Number of Visits 8   Date for OT Re-Evaluation 09/11/15   Authorization Type Medicaid - should have 3 evaluations and 24 visits between OT, PT, ST   Authorization Time Period requesting 8 visits through 09/11/15   Authorization - Visit Number 0   Authorization - Number of Visits 8   OT Start Time 1355   OT Stop Time 1435   OT Time Calculation (min) 40 min   Activity Tolerance Patient tolerated treatment well   Behavior During Therapy [facility] for tasks assessed/performed      Past Medical History  Diagnosis Date  . Hypertension   . Myocardial infarction (HCC)     No past surgical history on file.  There were no vitals filed for this visit.  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) - Plan: Ot plan of care cert/re-cert  Decreased coordination - Plan: Ot plan of care cert/re-cert  Decreased pinch strength - Plan: Ot plan of care cert/re-cert  Homonymous hemianopsia due to recent stroke - Plan: Ot plan of care cert/re-cert      Subjective Assessment - 07/13/15 1648    Subjective  S:  "When am I going to be back to normal?"   Patient is accompained by: Family member  friend Bryan Schultz that he is currently living with   Pertinent History Bryan Schultz suffered a CVA on 05/18/15 due to thrombosis of his left PCA, causing right hemiparesis, ataxia, and right homonymous hemianopsia.  He was hospitalized and a patient in CIR from 05/18/15-06/05/15.  He was discharged home on 06/05/15 and is now residing with his friend, Bryan Schultz.  He has been referred to occupational therapy for evaluation  and treatment.     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   Currently in Pain? Yes   Pain Score 9    Pain Location Hip   Pain Orientation Left   Pain Descriptors / Indicators Sharp   Pain Type Acute pain   Pain Onset In the past 7 days   Pain Frequency Constant   Aggravating Factors  standing and ambulating   Pain Relieving Factors rest warm weather   Effect of Pain on Daily Activities unable to ambulate as safely as when he is not experiencing pain           [facility] OT Assessment - 07/13/15 0001    Assessment   Diagnosis S/P CVA with right hemiparesis, ataxia, homonymous hemianopsia   Referring Provider Dr. Claudette Schultz    Onset Date 05/18/15   Prior Therapy CIR   Precautions   Precautions Other (comment)   Precaution Comments right homonymous hemianopsia with inattention to right side   Restrictions   Weight Bearing Restrictions No   Balance Screen   Has the patient fallen in the past 6 months No   Has the patient had a decrease in activity level because of a fear of falling?  No   Is the patient reluctant to leave their home because of a fear of falling?  No   Home  Environment   Family/patient expects to be discharged to: Private residence   Living Arrangements Non-relatives/Friends   Available Help at Discharge Friend(s)   Type of Home House   Lives With Friend(s)   Prior Function   Level of Independence Independent  driving   Vocation On disability   Leisure enjoys welding, hunting, fishing, piddling around the house   ADL   Eating/Feeding Independent   Grooming Min guard   Upper Body Bathing Independent   Lower Body Bathing Moderate assistance   Lower Body Bathing Patient Percentage --  assist with back, feet, buttock   Upper Body Dressing Independent   Lower Body Dressing Moderate assistance   Lower Body Dressing Patient Percentage --  assist with socks and shoes   Toilet Tranfer Supervision/safety    Toileting - Clothing Manipulation Minimal assistance   Toileting -  Hygiene Moderate assistance  with wiping buttock   ADL comments does not attend to right side without cuing, has trouble holding the urinal in place, unable to cook for himself .     IADL   Meal Prep Able to complete simple cold meal and snack prep;Needs to have meals prepared and served   Mobility   Mobility Status Comments ambulates with rolling walker SBA for safety with transfers and ambulation.  ambulation labored this date due to increased arthritic left hip pain   Written Expression   Dominant Hand Right   Vision - History   Baseline Vision Wears glasses only for reading   Vision Assessment   Visual Fields Right homonymous Hemainpsia   Comment needs further assessment - completed E/R cancel test today without his glasses, unable to attend to right side of paper, missed several in left side as well.    Cognition   Overall Cognitive Status Impaired/Different from baseline   Memory Impaired   Memory Impairment Decreased recall of new information;Storage deficit;Decreased short term memory   Sensation   Light Touch Appears Intact   Stereognosis Impaired Detail   Stereognosis Impaired Details Impaired RUE  0/3 right 3/3 left correct - on right 1/3 new was coin not t   Hot/Cold Not tested   Coordination   Gross Motor Movements are Fluid and Coordinated Yes   Fine Motor Movements are Fluid and Coordinated No   9 Hole Peg Test Right;Left   Right 9 Hole Peg Test 2 minutes 4.17"   Left 9 Hole Peg Test 42.27"   Coordination right hand nine hole peg test - picked up peg and moved with arm vs in hand manipulation initially and eventually able to manipulate in hand.  difficulty placing directly in hole rather circles around it until he locates hole   Perception   Inattention/Neglect Does not attend to right visual field   Praxis   Praxis Impaired   Praxis Impairment Details Motor planning   Praxis-Other Comments  states he knows what he needs to do and can't make his right arm do it   ROM / Strength   AROM / PROM / Strength AROM;Strength   AROM   Overall AROM Comments  WFL BUE   Strength   Overall Strength Comments WFL BUE except right shoulder flexion and abduction 4+/5   Hand Function   Right Hand Grip (lbs) 110   Right Hand Lateral Pinch 22 lbs   Right Hand 3 Point Pinch 8 lbs   Left Hand Grip (lbs) 95   Left Hand Lateral Pinch 20 lbs   Left 3 point pinch 14 lbs  RUE Tone   RUE Tone Within Functional Limits                         OT Education - 07/13/15 1829    Education provided Yes   Education Details Provided handout on homonymous hemianopsia information, exercises/strategies for stereognosis deficits, memory deficits, and fine motor coordination deficits.    Person(s) Educated Patient;Caregiver(s)   Methods Explanation;Demonstration;Handout   Comprehension Verbalized understanding;Returned demonstration          OT Short Term Goals - 07/13/15 1837    OT SHORT TERM GOAL #1   Title Patient will be educated on HEP for increased coordination, sensation, and memory needed to return to prior level of independence with B/IADLs.   Time 4   Period Weeks   Status New   OT SHORT TERM GOAL #2   Title Patient will improve to CGA assistance with all B/ADLs for increased safety and indepenence.   Time 4   Period Weeks   Status New   OT SHORT TERM GOAL #3   Title Patient will improve stereognosis to 2/3 accuracy in right hand for increased safety with household tasks.    Time 4   Period Weeks   Status New   OT SHORT TERM GOAL #4   Title Patient will be educated on strategies for improved memory and to compensate for visual deficits for increased safety at home.    Time 4   Period Weeks   Status New   OT SHORT TERM GOAL #5   Title Patient will improve right hand fine motor coordination for improved abiity to manipulate zippers and buttons by improving completion  time on nine hole peg test to 1 minute 30 seconds or less.    Time 4   Period Weeks   Status New           OT Long Term Goals - 07/13/15 1841    OT LONG TERM GOAL #1   Title Patient will complete all B/IADLs at SBA level or better for increased independence, using his right hand as dominant.   Time 8   Period Weeks   Status New   OT LONG TERM GOAL #2   Title Patient will improve stereognosis to 3/3 accuracy in his right hand for increased safety with ADLs.   Time 8   Period Weeks   Status New   OT LONG TERM GOAL #3   Title Patient will improve fine motor coordination needed for ability to weld by completing Nine Hole Peg test with his right hand in 50 seconds or less.   Time 8   Period Weeks   Status New   OT LONG TERM GOAL #4   Title Patient will utilize strategies for visual deficits and memory deficits to improve independence with BADLs independently.   Time 8   Period Weeks   Status New   OT LONG TERM GOAL #5   Title Patient will improve right shoulder strength to 5/5 and pinch strength to 12 pounds or better for improved abiity to hold urnial and maintain proper positioning to avoid spills.    Time 8   Period Weeks   Status New               Plan - 07/13/15 1832    Clinical Impression Statement A:  Patient is a 60 year old male s/p posterior cerebral artery thrombosis presenting with right sided weakness, ataxia, and right homonymous hemianopsia.  Past medical history includes arthritis, and hypertension.  Prior to CVA patient lived alone and was able to complete all B/IADLs, leisure activiites and drive independently.  Currently he is livin with a friend and receiving min level assistance with his B/IADLs, leisure actiivites.    Pt will benefit from skilled therapeutic intervention in order to improve on the following deficits (Retired) Decreased cognition;Decreased coordination;Decreased mobility;Decreased safety awareness;Decreased strength;Impaired perceived  functional ability;Impaired sensation;Impaired UE functional use;Impaired vision/preception   Rehab Potential Good   OT Frequency 2x / week   OT Duration 8 weeks   OT Treatment/Interventions Self-care/ADL training;Cryotherapy;Moist Heat;Electrical Stimulation;DME and/or AE instruction;Energy conservation;Neuromuscular education;Therapeutic exercise;Manual Therapy;Passive range of motion;Splinting;Therapeutic exercises;Therapeutic activities;Cognitive remediation/compensation;Visual/perceptual remediation/compensation;Patient/family education   Plan P:  Skilled OT intervention to improve on above listed deficits in order to return to prior level of function with B/[place] leisure activities.  Next visit:  further assessment of visual and cognitive deficits, begin ADL retraining.    Consulted and Agree with Plan of Care Patient;Family member/caregiver        Problem List Patient Active Problem List   Diagnosis Date Noted  . Right homonymous hemianopsia 07/03/2015  . Ataxia, post-stroke 07/03/2015  . Gait disturbance, post-stroke 07/03/2015  . Cognitive deficit, post-stroke 07/03/2015  . 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 Essential hypertension   . Morbid obesity (HCC)   . HLD (hyperlipidemia)   . Tobacco abuse   . Dysmetria   . Ataxia, late effect of cerebrovascular disease   . CVA (cerebral vascular accident) (HCC) 05/19/2015  . Acute CVA (cerebrovascular accident) [facility]) 05/18/2015    Shirlean Mylar, OTR/L [phone]  07/13/2015, 6:49 PM  [facility] [address] [place], [place], [address] Phone: [phone]   Fax:  [phone]  Name: Bryan Schultz MRN: [id number] Date of Birth: Aug 03, 1955

## 2015-07-13 NOTE — Patient Instructions (Signed)
DESENSITIZING YOUR HAND  What is "desnesitization"?  Following an injury to the hand a painful increase in sensation sometimes develops.  This hypersensitivity may be in response to touch, cold temperatures or in severe cases, even to wind blowing on the hand.  This hypersensitivity may be localized to a small area or may be widespread over an entire hand.  The initial injury causing the hypersensitivity may either be a rather severe injury but may times is only a trivial injury.  Desensitization is the process of decreasing the painful hypersensitivity by gradually exposing the sensitive area to a variety of carefully selected stimuli.  What will happen if my hand is not desensitized?  Over-protecting the sensitive are by not using it or covering it with a bandage or glove will only cause the sensitive area to become more painful to touch.  This, in turn, leads to increased stiffness, weakness and loss of use in the hand.  How do I desensitize my hand?  The goal of desensitization is to gradually build up a tolerance to stronger and stronger stimuli as the level of pain decreases.  A variety of objects of different textures are used to rub against the sensitive hand in such a way that the coarseness of the textures and the rubbing time are gradually increased as the pain diminishes over a period of days and weeks.  The following steps have been set up by your hand therapist and surgeon to serve as a general guide during your therapy and to help you better understand the proper techniques involved so that ultimately a more pain-free, useful hand is obtained.  1. Warm (not hot) water soaks or heating pad on a low setting. 2. Lotion 3. Rubbing exercises:  Using your normal hand, gently rub the most hypersensitive area with a back and forth motion for 30 seconds, rest briefly, and then repeat.  As tolerance to rubbing improves, slowly increase the pressure of rubbing one hand against the  other.  Gradually work up to a deep circular massage.  Once deep massage is tolerated, advance to gently rubbing the sensitive hand against selected materials with increasingly rougher surfaces as indicated below.  It is important to begin with soft, smooth textures and gradually work up to rough, hard textures.  Suggested sequence of desensitization materials: 1)  Cotton balls  2)  Flannel shirt or cotton shirt  3)  Soft velvet or felt  4)  Pants  5)  Towel  6)  Upholstery or rug 7)  Sand paper   Submerge hand into containers of the following household materials and slowly move your fingers about, grasping the materials and releasing them, it is beneficial to submerge the hand quickly and then pull it back out... Work on increasing speed!  The following materials are recommended: cotton balls, popcorn, rice, macaroni, beans or sand.  Rub a brush along the hand.  Massage the hand with vibrating instruments such as an electric razor, electric toothbrush or vibrator.  Tapping exercises: finger can be tapped against the various textures listed above.  Gradually increase the speed and force used with tapping.  To improve your fine motor coordination and to encourage normal use of the hand the following exercises are useful: 1.  Nuts and bolts 2.  Clothes pins 3.  Picking up  4.  Using a typewriter  5.  Piano  Avoid 1. Hot water.  This may increase swelling and cause more pain and stiffness. 2. Painful activities.  Desensitization may be   somewhat uncomfortable, but it should not actually be painful. 3. Over-protection. Again, covering the sensitive area or avoiding use of the painful finger or hand will only increase painful sensitivity.  Therefore, excessive pain, heat and over-protection may do more harm than good.  You should not experience increased pain and swelling following your desensitization exercises.  Your therapist or surgeon will tailor the protocol to help meet your specific  needs.  Do not hesitate to ask them any questions that you may have.  USE THE HAND AS NORMALLY AS POSSIBLE  Management of Memory Problems  There are some general things you can do to help manage your memory problems.  Your memory may not in fact recover, but by using techniques and strategies you will be able to manage your memory difficulties better.  1)  Establish a routine.  Try to establish and then stick to a regular routine.  By doing this, you will get used to what to expect and you will reduce the need to rely on your memory.  Also, try to do things at the same time of day, such as taking your medication or checking your calendar first thing in the morning.  Think about think that you can do as a part of a regular routine and make a list.  Then enter them into a daily planner to remind you.  This will help you establish a routine.  2)  Organize your environment.  Organize your environment so that it is uncluttered.  Decrease visual stimulation.  Place everyday items such as keys or cell phone in the same place every day (ie.  Basket next to front door)  Use post it notes with a brief message to yourself (ie. Turn off light, lock the door)  Use labels to indicate where things go (ie. Which cupboards are for food, dishes, etc.)  Keep a notepad and pen by the telephone to take messages  3)  Memory Aids  A diary or journal/notebook/daily planner  Making a list (shopping list, chore list, to do list that needs to be done)  Using an alarm as a reminder (kitchen timer or cell phone alarm)  Using cell phone to store information (Notes, Calendar, Reminders)  Calendar/White board placed in a prominent position  Post-it notes  In order for memory aids to be useful, you need to have good habits.  It's no good remembering to make a note in your journal if you don't remember to look in it.  Try setting aside a certain time of day to look in journal.  4)  Improving mood and managing  fatigue.  There may be other factors that contribute to memory difficulties.  Factors, such as anxiety, depression and tiredness can affect memory.  Regular gentle exercise can help improve your mood and give you more energy.  Simple relaxation techniques may help relieve symptoms of anxiety  Try to get back to completing activities or hobbies you enjoyed doing in the past.  Learn to pace yourself through activities to decrease fatigue.  Find out about some local support groups where you can share experiences with others. Try and achieve 7-8 hours of sleep at night. times a day, as recommended by your occupational therapist.  Fine Motor Coordination Exercises  Perform the following exercises 3 times per day  Close all fingers and thumb into a tight fist and then open wide. (10 times)  Palm of hand on table, spread fingers apart, then together. (10 times)  Lift fingers and thumb  off table one at a time. Increase speed as able. (10 times)  Touch thumb to each fingertip. Increase speed as able. (10 times)  Pick up 5 small objects (coins, marbles, paperclips, beads, etc.) one at a time and hold them in hand, then place them one by one onto the table.  Thread buttons or beads onto a string.  Play card games. Practice shuffling and dealing cards. Flip cards over onto table one by one.  Practice screwing and unscrewing nuts/bolts.  Stack approximately [employer] (checkers, coins, etc.) onto table.

## 2015-07-30 ENCOUNTER — Encounter (HOSPITAL_COMMUNITY): Payer: Medicaid Other | Admitting: Occupational Therapy

## 2015-07-30 ENCOUNTER — Ambulatory Visit (HOSPITAL_COMMUNITY): Payer: Medicaid Other | Attending: Physical Medicine & Rehabilitation | Admitting: Physical Therapy

## 2015-07-30 ENCOUNTER — Ambulatory Visit (HOSPITAL_COMMUNITY): Payer: Medicaid Other | Admitting: Speech Pathology

## 2015-07-30 DIAGNOSIS — I69998 Other sequelae following unspecified cerebrovascular disease: Secondary | ICD-10-CM | POA: Diagnosis present

## 2015-07-30 DIAGNOSIS — R269 Unspecified abnormalities of gait and mobility: Secondary | ICD-10-CM

## 2015-07-30 DIAGNOSIS — R279 Unspecified lack of coordination: Secondary | ICD-10-CM | POA: Insufficient documentation

## 2015-07-30 DIAGNOSIS — I63532 Cerebral infarction due to unspecified occlusion or stenosis of left posterior cerebral artery: Secondary | ICD-10-CM | POA: Diagnosis present

## 2015-07-30 DIAGNOSIS — R29898 Other symptoms and signs involving the musculoskeletal system: Secondary | ICD-10-CM

## 2015-07-30 DIAGNOSIS — H53469 Homonymous bilateral field defects, unspecified side: Secondary | ICD-10-CM | POA: Diagnosis present

## 2015-07-30 DIAGNOSIS — R2689 Other abnormalities of gait and mobility: Secondary | ICD-10-CM | POA: Diagnosis present

## 2015-07-30 DIAGNOSIS — R278 Other lack of coordination: Secondary | ICD-10-CM

## 2015-07-30 NOTE — Therapy (Signed)
[facility] [address] [place], [place], [address] Phone: [phone]   Fax:  [phone]  Physical Therapy Evaluation  Patient Details  Name: Bryan Schultz MRN: [id number] Date of Birth: 1955/11/12 Referring Provider: Camille Bal  Encounter Date: 07/30/2015      PT End of Session - 07/30/15 1612    Visit Number 1   Number of Visits 12   Date for PT Re-Evaluation 09/05/15   Authorization Type medicaid   PT Start Time 1525   PT Stop Time 1600   PT Time Calculation (min) 35 min   Activity Tolerance Patient tolerated treatment well   Behavior During Therapy [facility] for tasks assessed/performed      Past Medical History  Diagnosis Date  . Hypertension   . Myocardial infarction (HCC)     No past surgical history on file.  There were no vitals filed for this visit.  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  He stayed in acute until 12/26 and then was transferred to inpatient rehab where he stayed until 06/05/2015.  He had home health and is now being referred to outpatient physical therapy to maximize his functional level.     Pertinent History Pt had been on disabliity prior to the stroke but was not using any assistive device for ambulating.   How long can you sit comfortably? no problem    How long can you stand comfortably? able to stand for 5-10 minutes    How long can you walk comfortably? 200 feet taking approximatley 5 minutes.    Currently in Pain? No/denies             [facility] PT Assessment - 07/30/15 0001    Assessment   Medical Diagnosis Lt occipiatal infarct    Referring Provider Camille Bal   Onset Date/Surgical Date 06/17/14   Next MD Visit unknown   Prior Therapy IP   Precautions   Precautions Fall   Precaution Comments right homonymous hemianopsia with inattention to right side   Restrictions   Weight Bearing Restrictions No   Balance Screen   Has the patient fallen in the past 6 months No   Has the patient had a decrease in activity level because of a fear of falling?  Yes   Is the patient reluctant to leave their home because of a fear of falling?  Yes   Home Environment   Living Environment Private residence   Prior Function   Level of Independence Independent   Vocation On disability   Leisure enjoys welding, hunting, fishing, piddling around the house   Functional Tests   Functional tests Single leg stance;Sit to Stand   Single Leg Stance   Comments Lt 1 second Rt 3 seconds    Sit to Stand   Comments 5 times in 37.05    ROM / Strength   AROM / PROM / Strength Strength   Strength   Strength Assessment Site Hip;Knee;Ankle   Right/Left Hip Right  Right Hip Flexion 4+/5   Right Hip Extension 3/5   Right Hip ABduction 4+/5   Right/Left Knee Right   Right Knee Extension 5/5   Right/Left Ankle Right   Right Ankle Dorsiflexion 3/5   6 Minute Walk- Baseline   6 Minute Walk- Baseline --  Ambulates with two canes for 244 feet in six minutes.                    [facility] Adult PT Treatment/Exercise - 07/30/15 0001    Exercises   Exercises Knee/Hip   Knee/Hip Exercises: Seated   Sit to Sand 10 reps   Knee/Hip Exercises: Supine   Bridges 10 reps   Other Supine Knee/Hip Exercises Ankle dorsiflexion x10                 PT Education - 07/30/15 1611    Education provided Yes   Education Details HEP for strengthening    Person(s) Educated Patient;Spouse   Methods Explanation;Demonstration;Handout    Comprehension Verbalized understanding;Returned demonstration          PT Short Term Goals - 07/30/15 1618    PT SHORT TERM GOAL #1   Title Pt will improve ankle strength to 4/5 to improve ability to ambulate on even ground.    Time 3   Period Weeks   Status New   PT SHORT TERM GOAL #2   Title Pt will improve single leg balance to 10 seconds bilaterally to feel comfortable walking with one cane.   Time 3   Period Weeks   Status New   PT SHORT TERM GOAL #3   Title Pt to improve coordination to be able to walk for 500 ft in six minutes.    Time 3   Period Weeks   Status New   PT SHORT TERM GOAL #4   Title Pt to be able to stand for 10 minutes to be able to take a shower without fear of falling.    Time 3   Period Weeks           PT Long Term Goals - 07/30/15 1622    PT LONG TERM GOAL #1   Title Pt strength to be 5/5 to be able to walk on uneven ground (grass and gravel) without an assistive device    Time 6   Period Weeks   Status New   PT LONG TERM GOAL #2   Title Pt single leg stance to be at 15 seconds to be able to ambulate without an assistive device    Time 6   Period Weeks   Status New   PT LONG TERM GOAL #3   Title Pt coordination to be improved to allow pt to be able to walk 1000 ft in six minutes.    Time 6   Period Weeks   Status New   PT LONG TERM GOAL #4   Title Pt to be independent in performing HEP    Time 6   Period Weeks   Status New               Plan - 07/30/15 1613    Clinical Impression Statement Bryan Schultz is a 60 yo male who had a left occipital lobe infarct in December on 2016.  He has had acute, In-patient and home health therapy and is now being referred for outpatient therapy.    Evaluation reveals an abnormal gait, decreased balance,decreased activity tolerance,  decreased coordination, and decreased strength.  He will benefit from skillled therapy to address these issues and maximize his functional ability.    Pt will benefit  from skilled therapeutic intervention in order to improve on the following deficits Abnormal gait;Decreased activity tolerance;Decreased balance;Decreased coordination;Obesity;Decreased strength;Difficulty walking   Rehab Potential Good   PT Frequency 2x / week   PT Duration 6 weeks   PT Treatment/Interventions Gait training;Stair training;Functional mobility training;Therapeutic activities;Therapeutic exercise;Balance training;Neuromuscular re-education;Patient/family education   PT Next Visit Plan Begin heelraises, functional squats, Single leg stance, step ups, heeltaps with opposite arm raise, progress to tandem stance, quadriped opposite arm/leg raise.    PT Home Exercise Plan given    Consulted and Agree with Plan of Care Patient         Problem List Patient Active Problem List   Diagnosis Date Noted  . Right homonymous hemianopsia 07/03/2015  . Ataxia, post-stroke 07/03/2015  . Gait disturbance, post-stroke 07/03/2015  . Cognitive deficit, post-stroke 07/03/2015  . 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 Essential hypertension   . Morbid obesity (HCC)   . HLD (hyperlipidemia)   . Tobacco abuse   . Dysmetria   . Ataxia, late effect of cerebrovascular disease   . CVA (cerebral vascular accident) (HCC) 05/19/2015  . Acute CVA (cerebrovascular accident) [facility]) 05/18/2015    Virgina Organ, PT CLT [phone] 07/30/2015, 4:31 PM  [facility] [address] [place], [place], [address] Phone: [phone]   Fax:  [phone]  Name: Alson Mcpheeters MRN: [id number] Date of Birth: November 02, 1955

## 2015-07-30 NOTE — Patient Instructions (Addendum)
Dorsiflexion: Resisted    Facing anchor, tubing around left foot, pull toward face.  Repeat __10__ times per set. Do _1___ sets per session. Do 2____ sessions per day.  http://orth.exer.us/8   Copyright  VHI. All rights reserved.  Functional Quadriceps: Sit to Stand    Sit on edge of chair, feet flat on floor. Stand upright, extending knees fully. Repeat _10___ times per set. Do _1___ sets per session. Do _3___ sessions per day.  http://orth.exer.us/734   Copyright  VHI. All rights reserved.  Bridging    Slowly raise buttocks from floor, keeping stomach tight. Repeat ____ times per set. Do ____ sets per session. Do ____ sessions per day.  http://orth.exer.us/1096   Copyright  VHI. All rights reserved.  Heel Raise: Bilateral (Standing)    Rise on balls of feet. Repeat __10__ times per set. Do __1__ sets per session. Do __3__ sessions per day.  http://orth.exer.us/38   Copyright  VHI. All rights reserved.  Toe Raise (Standing)    Rock back on heels. Repeat _10___ times per set. Do __1__ sets per session. Do __2__ sessions per day.  http://orth.exer.us/42   Copyright  VHI. All rights reserved.  Balance: Unilateral    Attempt to balance on left leg, eyes open. Hold __5-10__ seconds. Repeat __5__ times per set. Do __1__ sets per session. Do __2__ sessions per day. Perform exercise with eyes closed.  http://orth.exer.us/28   Copyright  VHI. All rights reserved.  Strengthening: Straight Leg Raise (Phase 1)    Tighten muscles on front of right thigh, then lift leg _15___ inches from surface, keeping knee locked.  Repeat __10__ times per set. Do ___1_ sets per session. Do _2___ sessions per day.  http://orth.exer.us/614   Copyright  VHI. All rights reserved.  Strengthening: Hip Abduction (Side-Lying)    Tighten muscles on front of left thigh, then lift leg 15____ inches from surface, keeping knee locked.  Repeat _10___ times per set. Do _1___  sets per session. Do __2__ sessions per day.  http://orth.exer.us/622   Copyright  VHI. All rights reserved.  Hip Extension (Prone)    Lift left leg __2__ inches from floor, keeping knee locked. Repeat __10__ times per set. Do _1___ sets per session. Do ___2_ sessions per day.  http://orth.exer.us/98   Copyright  VHI. All rights reserved.  Functional Quadriceps: Chair Squat    Keeping feet flat on floor, shoulder width apart, squat as low as is comfortable. Use support as necessary. Repeat _10___ times per set. Do __1__ sets per session. Do __2__ sessions per day.  http://orth.exer.us/736   Copyright  VHI. All rights reserved.

## 2015-07-31 ENCOUNTER — Encounter: Payer: Medicaid Other | Attending: Physical Medicine & Rehabilitation

## 2015-07-31 ENCOUNTER — Ambulatory Visit (HOSPITAL_BASED_OUTPATIENT_CLINIC_OR_DEPARTMENT_OTHER): Payer: Medicaid Other | Admitting: Physical Medicine & Rehabilitation

## 2015-07-31 ENCOUNTER — Encounter: Payer: Self-pay | Admitting: Physical Medicine & Rehabilitation

## 2015-07-31 VITALS — BP 147/72 | HR 78 | Resp 14

## 2015-07-31 DIAGNOSIS — I69319 Unspecified symptoms and signs involving cognitive functions following cerebral infarction: Secondary | ICD-10-CM | POA: Insufficient documentation

## 2015-07-31 DIAGNOSIS — F17210 Nicotine dependence, cigarettes, uncomplicated: Secondary | ICD-10-CM | POA: Diagnosis not present

## 2015-07-31 DIAGNOSIS — I69393 Ataxia following cerebral infarction: Secondary | ICD-10-CM | POA: Diagnosis not present

## 2015-07-31 DIAGNOSIS — R269 Unspecified abnormalities of gait and mobility: Secondary | ICD-10-CM | POA: Insufficient documentation

## 2015-07-31 DIAGNOSIS — E669 Obesity, unspecified: Secondary | ICD-10-CM | POA: Insufficient documentation

## 2015-07-31 DIAGNOSIS — I10 Essential (primary) hypertension: Secondary | ICD-10-CM | POA: Diagnosis not present

## 2015-07-31 DIAGNOSIS — I69398 Other sequelae of cerebral infarction: Secondary | ICD-10-CM | POA: Insufficient documentation

## 2015-07-31 DIAGNOSIS — H53461 Homonymous bilateral field defects, right side: Secondary | ICD-10-CM | POA: Insufficient documentation

## 2015-07-31 DIAGNOSIS — I63532 Cerebral infarction due to unspecified occlusion or stenosis of left posterior cerebral artery: Secondary | ICD-10-CM | POA: Diagnosis present

## 2015-07-31 NOTE — Patient Instructions (Signed)
No driving a car or 4 wheeler or riding mower
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
## 2015-07-31 NOTE — Progress Notes (Deleted)
   Subjective:    Patient ID: Bryan Schultz, male    DOB: 05/14/1956, 60 y.o.   MRN: [id number]  HPI  Pain Inventory Average Pain 9 Pain Right Now 9 My pain is sharp, burning and tingling  In the last 24 hours, has pain interfered with the following? General activity 5 Relation with others 6 Enjoyment of life 8 What TIME of day is your pain at its worst? evening, night Sleep (in general) Fair  Pain is worse with: sitting Pain improves with: NA Relief from Meds: 4  Mobility walk without assistance how many minutes can you walk? 30 ability to climb steps?  yes do you drive?  no  Function not employed: date last employed NA I need assistance with the following:  dressing, meal prep, household duties and shopping  Neuro/Psych depression anxiety loss of taste or smell  Prior Studies Any changes since last visit?  no  Physicians involved in your care Any changes since last visit?  no   Family History  Problem Relation Age of Onset  . Hypertension Mother   . Hypertension Father   . Hypertension Brother    Social History   Social History  . Marital Status: Divorced    Spouse Name: N/A  . Number of Children: N/A  . Years of Education: N/A   Social History Main Topics  . Smoking status: Current Every Day Smoker -- 0.50 packs/day for 40 years    Types: Cigarettes  . Smokeless tobacco: None  . Alcohol Use: No  . Drug Use: No  . Sexual Activity: Not Currently   Other Topics Concern  . None   Social History Narrative   History reviewed. No pertinent past surgical history. Past Medical History  Diagnosis Date  . Hypertension   . Myocardial infarction (HCC)    BP 112/64 mmHg  Pulse 78  Resp 15  SpO2 98%  Opioid Risk Score:   Fall Risk Score:  `1  Depression screen PHQ 2/9  Depression screen PHQ 2/9 07/03/2015  Decreased Interest 2  Down, Depressed, Hopeless 1  PHQ - 2 Score 3  Altered sleeping 2  Tired, decreased energy 2  Change in appetite 0    Feeling bad or failure about yourself  2  Trouble concentrating 2  Moving slowly or fidgety/restless 0  Suicidal thoughts 0  PHQ-9 Score 11  Difficult doing work/chores Somewhat difficult     Review of Systems  Constitutional:       Loss of taste or smell   Psychiatric/Behavioral: Positive for dysphoric mood. The patient is nervous/anxious.   All other systems reviewed and are negative.      Objective:   Physical Exam        Assessment & Plan:

## 2015-08-02 ENCOUNTER — Ambulatory Visit (HOSPITAL_COMMUNITY): Payer: Medicaid Other | Admitting: Occupational Therapy

## 2015-08-02 ENCOUNTER — Encounter (HOSPITAL_COMMUNITY): Payer: Self-pay | Admitting: Occupational Therapy

## 2015-08-02 ENCOUNTER — Ambulatory Visit (HOSPITAL_COMMUNITY): Payer: Medicaid Other | Admitting: Physical Therapy

## 2015-08-02 DIAGNOSIS — H53469 Homonymous bilateral field defects, unspecified side: Secondary | ICD-10-CM

## 2015-08-02 DIAGNOSIS — I69398 Other sequelae of cerebral infarction: Secondary | ICD-10-CM

## 2015-08-02 DIAGNOSIS — R278 Other lack of coordination: Secondary | ICD-10-CM

## 2015-08-02 DIAGNOSIS — R279 Unspecified lack of coordination: Secondary | ICD-10-CM | POA: Diagnosis not present

## 2015-08-02 DIAGNOSIS — R29898 Other symptoms and signs involving the musculoskeletal system: Secondary | ICD-10-CM

## 2015-08-02 NOTE — Therapy (Signed)
[place] [facility] [address] [place], [place], [address] Phone: [phone]   Fax:  [phone]  Occupational Therapy Treatment  Patient Details  Name: Bryan CelesteJohn Schultz MRN: [id number] Date of Birth: 01/07/1956 Referring Provider: Dr. Claudette LawsAndrew Kirsteins   Encounter Date: 08/02/2015      OT End of Session - 08/02/15 1731    Visit Number 2   Number of Visits 8   Date for OT Re-Evaluation 09/11/15   Authorization Type Medicaid - should have 3 evaluations and 24 visits between OT, PT, ST   Authorization Time Period 10 visits approved 3/9-09/24/2015   Authorization - Visit Number 1   Authorization - Number of Visits 10   OT Start Time 1354  pt arrived late for session   OT Stop Time 1432   OT Time Calculation (min) 38 min   Activity Tolerance Patient tolerated treatment well   Behavior During Therapy [facility] for tasks assessed/performed      Past Medical History  Diagnosis Date  . Hypertension   . Myocardial infarction (HCC)     No past surgical history on file.  There were no vitals filed for this visit.  Visit Diagnosis:  Decreased coordination  Decreased pinch strength  Homonymous hemianopsia due to recent stroke      Subjective Assessment - 08/02/15 1729    Subjective  S: I need to be able to use this hand and arm normally.    Currently in Pain? No/denies           [facility] Assessment - 08/02/15 1722    Assessment   Diagnosis S/P CVA with right hemiparesis, ataxia, homonymous hemianopsia   Precautions   Precautions Fall   Precaution Comments right homonymous hemianopsia with inattention to right side   Cognition   Overall Cognitive Status Impaired/Different from baseline   Observation/Other Assessments   Outcome Measures MOCA Score: 16/30                  OT Treatments/Exercises (OP) - 08/02/15 1729    Exercises   Exercises Hand   Hand Exercises   Stereognosis Pt completed stereognosis activity this session,  looking for sponges in bucket of popcorn. Pt was able to feel and identify 3/6 sponges.                  OT Short Term Goals - 08/02/15 1732    OT SHORT TERM GOAL #1   Title Patient will be educated on HEP for increased coordination, sensation, and memory needed to return to prior level of independence with B/IADLs.   Time 4   Period Weeks   Status On-going   OT SHORT TERM GOAL #2   Title Patient will improve to CGA assistance with all B/ADLs for increased safety and indepenence.   Time 4   Period Weeks   Status On-going   OT SHORT TERM GOAL #3   Title Patient will improve stereognosis to 2/3 accuracy in right hand for increased safety with household tasks.    Time 4   Period Weeks   Status On-going   OT SHORT TERM GOAL #4   Title Patient will be educated on strategies for improved memory and to compensate for visual deficits for increased safety at home.    Time 4   Period Weeks   Status On-going   OT SHORT TERM GOAL #5   Title Patient will improve right hand fine motor coordination for improved abiity to manipulate zippers and buttons  by improving completion time on nine hole peg test to 1 minute 30 seconds or less.    Time 4   Period Weeks   Status On-going           OT Long Term Goals - 08/02/15 1732    OT LONG TERM GOAL #1   Title Patient will complete all B/IADLs at SBA level or better for increased independence, using his right hand as dominant.   Time 8   Period Weeks   Status On-going   OT LONG TERM GOAL #2   Title Patient will improve stereognosis to 3/3 accuracy in his right hand for increased safety with ADLs.   Time 8   Period Weeks   Status On-going   OT LONG TERM GOAL #3   Title Patient will improve fine motor coordination needed for ability to weld by completing Nine Hole Peg test with his right hand in 50 seconds or less.   Time 8   Period Weeks   Status On-going   OT LONG TERM GOAL #4   Title Patient will utilize strategies for visual  deficits and memory deficits to improve independence with BADLs independently.   Time 8   Period Weeks   Status On-going   OT LONG TERM GOAL #5   Title Patient will improve right shoulder strength to 5/5 and pinch strength to 12 pounds or better for improved abiity to hold urnial and maintain proper positioning to avoid spills.    Time 8   Period Weeks   Status On-going               Plan - 08/02/15 1732    Clinical Impression Statement A: Pt completed MOCA this session, requiring extended time and scoring a 16/30, as well as stereognosis task. Pt and friend educated on sensory reintegration and potential for sensation to return to RUE. Pt and friend demonstrate understanding, handout for sensory reintegration activities provided along with evaluation. Pt requests to attend PT & OT one time per week as it is too difficult for him to get here 2x/week.    Plan P: Further assessment of visual deficits, continue to assess cognition in functional settings. Begin ADL retraining.         Problem List Patient Active Problem List   Diagnosis Date Noted  . Right homonymous hemianopsia 07/03/2015  . Ataxia, post-stroke 07/03/2015  . Gait disturbance, post-stroke 07/03/2015  . Cognitive deficit, post-stroke 07/03/2015  . 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 Essential hypertension   . Morbid obesity (HCC)   . HLD (hyperlipidemia)   . Tobacco abuse   . Dysmetria   . Ataxia, late effect of cerebrovascular disease   . CVA (cerebral vascular accident) (HCC) 05/19/2015  . Acute CVA (cerebrovascular accident) [facility]) 05/18/2015    Ezra Sites, OTR/L  [phone]  08/02/2015, 5:37 PM  [place] [facility] [address] [place], [place], [address] Phone: [phone]   Fax:  [phone]  Name: Goodwin Kamphaus MRN: [id number] Date of Birth:  [birth year]

## 2015-08-06 ENCOUNTER — Ambulatory Visit (HOSPITAL_COMMUNITY): Payer: Medicaid Other | Admitting: Physical Therapy

## 2015-08-06 ENCOUNTER — Ambulatory Visit (HOSPITAL_COMMUNITY): Payer: Medicaid Other | Admitting: Specialist

## 2015-08-06 DIAGNOSIS — I69398 Other sequelae of cerebral infarction: Secondary | ICD-10-CM

## 2015-08-06 DIAGNOSIS — R279 Unspecified lack of coordination: Secondary | ICD-10-CM | POA: Diagnosis not present

## 2015-08-06 DIAGNOSIS — H53469 Homonymous bilateral field defects, unspecified side: Secondary | ICD-10-CM

## 2015-08-06 DIAGNOSIS — R278 Other lack of coordination: Secondary | ICD-10-CM

## 2015-08-06 DIAGNOSIS — R29898 Other symptoms and signs involving the musculoskeletal system: Secondary | ICD-10-CM

## 2015-08-06 NOTE — Therapy (Signed)
[facility] [address] [place], [place], [address] Phone: [phone]   Fax:  [phone]  Occupational Therapy Treatment  Patient Details  Name: Bryan Schultz MRN: [id number] Date of Birth: 10/24/1955 Referring Provider: Dr. Claudette Laws   Encounter Date: 08/06/2015      OT End of Session - 08/06/15 1448    Visit Number 3   Number of Visits 8   Date for OT Re-Evaluation 09/11/15   Authorization Type Medicaid   Authorization Time Period 10 visits approved 3/9-09/24/2015   Authorization - Visit Number 2   Authorization - Number of Visits 10   OT Start Time 1317   OT Stop Time 1420   OT Time Calculation (min) 63 min   Activity Tolerance Patient tolerated treatment well   Behavior During Therapy [facility] for tasks assessed/performed      Past Medical History  Diagnosis Date  . Hypertension   . Myocardial infarction (HCC)     No past surgical history on file.  There were no vitals filed for this visit.  Visit Diagnosis:  Decreased coordination  Decreased pinch strength  Homonymous hemianopsia due to recent stroke      Subjective Assessment - 08/06/15 1446    Subjective  S:  My hand is just so numb.   Currently in Pain? Yes   Pain Score 9    Pain Location Leg   Pain Orientation Right;Left   Pain Descriptors / Indicators Aching   Pain Type Acute pain   Pain Onset In the past 7 days   Pain Frequency Constant   Aggravating Factors  standing and ambulating   Pain Relieving Factors warm weather   Effect of Pain on Daily Activities decreased safety and independence with ambulation            [facility] OT Assessment - 08/06/15 0001    Assessment   Diagnosis S/P CVA with right hemiparesis, ataxia, homonymous hemianopsia                  OT Treatments/Exercises (OP) - 08/06/15 0001    Transfers   Comments when ambulating in waiting room, he cut his right turn to enter the treatment area short, placing his foot  under a chair. therapist allowed time for self correction, when none demonstrated, therapist cued client to attend to right and correct leg placement in order to avoid falling into chair.    Cognitive Exercises   Other Cognitive Exercises 1 visual exercises:  completed 2 pegboard designs (A1 and A2).  Had one mistake on each design, on A1 did not leave open row at bottom and was able to correct with moderate cuing.  on A2 he used the blue pegs rather than purple.  due to the coloring on the design sheet, this is a common mistake made by many of our patients.   Was able to scan the entire design without cuing.    Sensation Exercises   Sensory Retraining used right hand with max verbal guidance to pick up small pegs and place in pegboard.  Patient did not initially use right hand to complete task, until cued to do so.  Reuqired cuing to use hand vs talbe to move peg in his hand.                  OT Education - 08/06/15 1447    Education provided Yes   Education Details issued 4 visual scanning exercise sheets for patient to complete as HEP.  Person(s) Educated Secretary/administrator)   Methods Explanation;Demonstration;Handout   Comprehension Verbalized understanding          OT Short Term Goals - 08/02/15 1732    OT SHORT TERM GOAL #1   Title Patient will be educated on HEP for increased coordination, sensation, and memory needed to return to prior level of independence with B/IADLs.   Time 4   Period Weeks   Status On-going   OT SHORT TERM GOAL #2   Title Patient will improve to CGA assistance with all B/ADLs for increased safety and indepenence.   Time 4   Period Weeks   Status On-going   OT SHORT TERM GOAL #3   Title Patient will improve stereognosis to 2/3 accuracy in right hand for increased safety with household tasks.    Time 4   Period Weeks   Status On-going   OT SHORT TERM GOAL #4   Title Patient will be educated on strategies for improved memory and to compensate  for visual deficits for increased safety at home.    Time 4   Period Weeks   Status On-going   OT SHORT TERM GOAL #5   Title Patient will improve right hand fine motor coordination for improved abiity to manipulate zippers and buttons by improving completion time on nine hole peg test to 1 minute 30 seconds or less.    Time 4   Period Weeks   Status On-going           OT Long Term Goals - 08/02/15 1732    OT LONG TERM GOAL #1   Title Patient will complete all B/IADLs at SBA level or better for increased independence, using his right hand as dominant.   Time 8   Period Weeks   Status On-going   OT LONG TERM GOAL #2   Title Patient will improve stereognosis to 3/3 accuracy in his right hand for increased safety with ADLs.   Time 8   Period Weeks   Status On-going   OT LONG TERM GOAL #3   Title Patient will improve fine motor coordination needed for ability to weld by completing Nine Hole Peg test with his right hand in 50 seconds or less.   Time 8   Period Weeks   Status On-going   OT LONG TERM GOAL #4   Title Patient will utilize strategies for visual deficits and memory deficits to improve independence with BADLs independently.   Time 8   Period Weeks   Status On-going   OT LONG TERM GOAL #5   Title Patient will improve right shoulder strength to 5/5 and pinch strength to 12 pounds or better for improved abiity to hold urnial and maintain proper positioning to avoid spills.    Time 8   Period Weeks   Status On-going               Plan - 08/06/15 1448    Clinical Impression Statement A:  Patient completed 2 simple peg board designs, with 1 error on each design.  Verbal guidance to use right hand to manipulate pegs throughout session.     Plan P:  Continue visual assessment/treatment complete abstract shape design construction, simple meal prep, issue HEP for fine motor coordination.    Consulted and Agree with Plan of Care Patient        Problem  List Patient Active Problem List   Diagnosis Date Noted  . Right homonymous hemianopsia 07/03/2015  . Ataxia, post-stroke 07/03/2015  .  Gait disturbance, post-stroke 07/03/2015  . Cognitive deficit, post-stroke 07/03/2015  . 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 Essential hypertension   . Morbid obesity (HCC)   . HLD (hyperlipidemia)   . Tobacco abuse   . Dysmetria   . Ataxia, late effect of cerebrovascular disease   . CVA (cerebral vascular accident) (HCC) 05/19/2015  . Acute CVA (cerebrovascular accident) [facility](HCC) 05/18/2015    Shirlean MylarBethany H. Murray, OTR/L [phone]  08/06/2015, 2:55 PM  [facility] [address] [place], [place], [address] Phone: [phone]   Fax:  [phone]  Name: Wille CelesteJohn Condrey MRN: [id number] Date of Birth: 06/24/1955

## 2015-08-09 ENCOUNTER — Encounter (HOSPITAL_COMMUNITY): Payer: Medicaid Other | Admitting: Occupational Therapy

## 2015-08-09 ENCOUNTER — Ambulatory Visit (HOSPITAL_COMMUNITY): Payer: Medicaid Other | Admitting: Physical Therapy

## 2015-08-13 ENCOUNTER — Ambulatory Visit (HOSPITAL_COMMUNITY): Payer: Medicaid Other | Admitting: Specialist

## 2015-08-13 ENCOUNTER — Ambulatory Visit (HOSPITAL_COMMUNITY): Payer: Medicaid Other | Admitting: Physical Therapy

## 2015-08-13 DIAGNOSIS — R279 Unspecified lack of coordination: Principal | ICD-10-CM

## 2015-08-13 DIAGNOSIS — R278 Other lack of coordination: Secondary | ICD-10-CM

## 2015-08-13 NOTE — Patient Instructions (Signed)
TOILETING TECHNIQUES 1. WHEN PEEING, SIT AND SCOOT ALL THE WAY BACK ON THE TOILET SEAT AND TUCK IT. 2. WHEN HAVING A BOWEL MOVEMENT, SIT AND SCOOT ALL THE WAY FORWARD.  WHEN TIME TO WIPE, STAY SEATED AND REACH BACK WITH YOUR RIGHT HAND AND WIPE.  IF IT'S DIFFICULT TO REACH, TRY THE TONGS. 3.  WHEN WASHING YOUR BOTTOM, SIT ON A SOAPY WASHCLOTH OR TOWEL.  LEAN TO THE LEFT AND WASH WITH YOUR RIGHT HAND.  THEN LEAN TO THE RIGHT AND WASH WITH YOUR LEFT HAND.

## 2015-08-13 NOTE — Therapy (Signed)
[facility] [address] [place], [place], [address] Phone: [phone]   Fax:  [phone]  Occupational Therapy Treatment  Patient Details  Name: Bryan Schultz MRN: [id number] Date of Birth: 01/31/1956 Referring Provider: Dr. Claudette LawsAndrew Kirsteins   Encounter Date: 08/13/2015      OT End of Session - 08/13/15 1639    Visit Number 4   Number of Visits 8   Date for OT Re-Evaluation 09/11/15   Authorization Type Medicaid   Authorization Time Period 10 visits approved 3/9-09/24/2015   Authorization - Visit Number 3   Authorization - Number of Visits 10   OT Start Time 1305   OT Stop Time 1345   OT Time Calculation (min) 40 min   Activity Tolerance Patient tolerated treatment well   Behavior During Therapy [facility] for tasks assessed/performed      Past Medical History  Diagnosis Date  . Hypertension   . Myocardial infarction (HCC)     No past surgical history on file.  There were no vitals filed for this visit.  Visit Diagnosis:  Decreased coordination      Subjective Assessment - 08/13/15 1633    Subjective  S:  I can't wipe myself like I used to.    Currently in Pain? No/denies            [facility] OT Assessment - 08/13/15 0001    Assessment   Diagnosis S/P CVA with right hemiparesis, ataxia, homonymous hemianopsia   Precautions   Precautions Fall                  OT Treatments/Exercises (OP) - 08/13/15 0001    ADLs   Toileting Patient's friend stated that Mr. Bryan Schultz was having difficulty completing toileting hygiene and is currently dependent.  Both parties expressed the desire for Mr. Bryan Schultz to be able to complete this activity on his own.  Patient states that before his stroke he stood and bent over to wipe after a BM.  Due to balance issues, he is no longer able to do so.  I educated him on technique to wipe seated.  Educated patient to sit on toilet, scoot forward as far as possible and then wipe.  We then tried a  dry run.  Using this technique, he had difficulty reaching to wipe with his left hand.  when directed to use his right hand, he was able to reach further to his buttock where he would need to wipe.  I also educated both parties on the use of a toilet aid for wiping.  Patient is also having trouble urinating in the toilet.  I educated for him to sit on the toilet, scoot all the way back and tuck his penis so that urine goes in the toilet vs on the floor.     Bathing Patient is having difficulty washing his buttock.  I educated patient and friend to get a hand towel wet and soapy and place on shower seat, then when he sits on the shower seat, his bottom will be cleansed.  I also educated him to lean to left and wash right side of bottom with right hand and then lean to right and wash left bottom with left hand.  Patient simulated this activity with good form.    Functional Mobility Patient has a friends home with several steps to enter.  Patietn completed reciprocal ambualtion wtih bilateral railing up and down steps with SBA for safety.  OT Education - 08/13/15 1638    Education provided Yes   Education Details step by step instructions for seated toileting hygiene and bathing of buttock.   Person(s) Educated Patient;Caregiver(s)   Methods Explanation;Demonstration;Handout   Comprehension Verbalized understanding;Returned demonstration;Verbal cues required          OT Short Term Goals - 08/02/15 1732    OT SHORT TERM GOAL #1   Title Patient will be educated on HEP for increased coordination, sensation, and memory needed to return to prior level of independence with B/IADLs.   Time 4   Period Weeks   Status On-going   OT SHORT TERM GOAL #2   Title Patient will improve to CGA assistance with all B/ADLs for increased safety and indepenence.   Time 4   Period Weeks   Status On-going   OT SHORT TERM GOAL #3   Title Patient will improve stereognosis to 2/3 accuracy in  right hand for increased safety with household tasks.    Time 4   Period Weeks   Status On-going   OT SHORT TERM GOAL #4   Title Patient will be educated on strategies for improved memory and to compensate for visual deficits for increased safety at home.    Time 4   Period Weeks   Status On-going   OT SHORT TERM GOAL #5   Title Patient will improve right hand fine motor coordination for improved abiity to manipulate zippers and buttons by improving completion time on nine hole peg test to 1 minute 30 seconds or less.    Time 4   Period Weeks   Status On-going           OT Long Term Goals - 08/02/15 1732    OT LONG TERM GOAL #1   Title Patient will complete all B/IADLs at SBA level or better for increased independence, using his right hand as dominant.   Time 8   Period Weeks   Status On-going   OT LONG TERM GOAL #2   Title Patient will improve stereognosis to 3/3 accuracy in his right hand for increased safety with ADLs.   Time 8   Period Weeks   Status On-going   OT LONG TERM GOAL #3   Title Patient will improve fine motor coordination needed for ability to weld by completing Nine Hole Peg test with his right hand in 50 seconds or less.   Time 8   Period Weeks   Status On-going   OT LONG TERM GOAL #4   Title Patient will utilize strategies for visual deficits and memory deficits to improve independence with BADLs independently.   Time 8   Period Weeks   Status On-going   OT LONG TERM GOAL #5   Title Patient will improve right shoulder strength to 5/5 and pinch strength to 12 pounds or better for improved abiity to hold urnial and maintain proper positioning to avoid spills.    Time 8   Period Weeks   Status On-going               Plan - 08/13/15 1639    Clinical Impression Statement A:  Patient educated on new techniques for toileting hygiene and bathing of buttock and able to simulate with SBA for verbal guidance.  Step by step instructions given to patient  and a cue card to place in bathroom also given to patient.    Plan P:  contineu visual treatment complete abstract shape Chief Executive Officer, simple meal prep, issue  HEP for [facility].        Problem List Patient Active Problem List   Diagnosis Date Noted  . Right homonymous hemianopsia 07/03/2015  . Ataxia, post-stroke 07/03/2015  . Gait disturbance, post-stroke 07/03/2015  . Cognitive deficit, post-stroke 07/03/2015  . 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 Essential hypertension   . Morbid obesity (HCC)   . HLD (hyperlipidemia)   . Tobacco abuse   . Dysmetria   . Ataxia, late effect of cerebrovascular disease   . CVA (cerebral vascular accident) (HCC) 05/19/2015  . Acute CVA (cerebrovascular accident) [facility]) 05/18/2015    Shirlean Mylar, OTR/L [phone]  08/13/2015, 4:41 PM  [facility] [address] [place], [place], [address] Phone: [phone]   Fax:  [phone]  Name: Davion Flannery MRN: [id number] Date of Birth: June 02, 1955

## 2015-08-16 ENCOUNTER — Ambulatory Visit (HOSPITAL_COMMUNITY): Payer: Medicaid Other | Admitting: Physical Therapy

## 2015-08-16 ENCOUNTER — Encounter (HOSPITAL_COMMUNITY): Payer: Medicaid Other | Admitting: Specialist

## 2015-08-20 ENCOUNTER — Ambulatory Visit (HOSPITAL_COMMUNITY): Payer: Medicaid Other | Admitting: Specialist

## 2015-08-20 ENCOUNTER — Ambulatory Visit (HOSPITAL_COMMUNITY): Payer: Medicaid Other | Admitting: Physical Therapy

## 2015-08-23 ENCOUNTER — Ambulatory Visit (HOSPITAL_COMMUNITY): Payer: Medicaid Other | Admitting: Physical Therapy

## 2015-08-23 ENCOUNTER — Encounter (HOSPITAL_COMMUNITY): Payer: Medicaid Other | Admitting: Occupational Therapy

## 2015-08-27 ENCOUNTER — Ambulatory Visit (HOSPITAL_COMMUNITY): Payer: Medicaid Other | Attending: Physical Medicine & Rehabilitation | Admitting: Occupational Therapy

## 2015-08-27 ENCOUNTER — Ambulatory Visit (HOSPITAL_COMMUNITY): Payer: Medicaid Other | Admitting: Physical Therapy

## 2015-08-27 DIAGNOSIS — R41842 Visuospatial deficit: Secondary | ICD-10-CM | POA: Insufficient documentation

## 2015-08-27 DIAGNOSIS — R278 Other lack of coordination: Secondary | ICD-10-CM | POA: Insufficient documentation

## 2015-08-27 DIAGNOSIS — R29898 Other symptoms and signs involving the musculoskeletal system: Secondary | ICD-10-CM | POA: Insufficient documentation

## 2015-08-30 ENCOUNTER — Ambulatory Visit (HOSPITAL_COMMUNITY): Payer: Medicaid Other | Admitting: Physical Therapy

## 2015-08-30 ENCOUNTER — Encounter (HOSPITAL_COMMUNITY): Payer: Medicaid Other | Admitting: Occupational Therapy

## 2015-09-03 ENCOUNTER — Ambulatory Visit (HOSPITAL_COMMUNITY): Payer: Medicaid Other | Admitting: Specialist

## 2015-09-03 ENCOUNTER — Ambulatory Visit (HOSPITAL_COMMUNITY): Payer: Medicaid Other | Admitting: Physical Therapy

## 2015-09-03 DIAGNOSIS — R41842 Visuospatial deficit: Secondary | ICD-10-CM | POA: Diagnosis present

## 2015-09-03 DIAGNOSIS — R278 Other lack of coordination: Secondary | ICD-10-CM

## 2015-09-03 DIAGNOSIS — R29898 Other symptoms and signs involving the musculoskeletal system: Secondary | ICD-10-CM | POA: Diagnosis present

## 2015-09-03 NOTE — Patient Instructions (Signed)
DESENSITIZING YOUR HAND  What is "desnesitization"?  Following an injury to the hand a painful increase in sensation sometimes develops.  This hypersensitivity may be in response to touch, cold temperatures or in severe cases, even to wind blowing on the hand.  This hypersensitivity may be localized to a small area or may be widespread over an entire hand.  The initial injury causing the hypersensitivity may either be a rather severe injury but may times is only a trivial injury.  Desensitization is the process of decreasing the painful hypersensitivity by gradually exposing the sensitive area to a variety of carefully selected stimuli.  What will happen if my hand is not desensitized?  Over-protecting the sensitive are by not using it or covering it with a bandage or glove will only cause the sensitive area to become more painful to touch.  This, in turn, leads to increased stiffness, weakness and loss of use in the hand.  How do I desensitize my hand?  The goal of desensitization is to gradually build up a tolerance to stronger and stronger stimuli as the level of pain decreases.  A variety of objects of different textures are used to rub against the sensitive hand in such a way that the coarseness of the textures and the rubbing time are gradually increased as the pain diminishes over a period of days and weeks.  The following steps have been set up by your hand therapist and surgeon to serve as a general guide during your therapy and to help you better understand the proper techniques involved so that ultimately a more pain-free, useful hand is obtained.  1. Warm (not hot) water soaks or heating pad on a low setting. 2. Lotion 3. Rubbing exercises:  Using your normal hand, gently rub the most hypersensitive area with a back and forth motion for 30 seconds, rest briefly, and then repeat.  As tolerance to rubbing improves, slowly increase the pressure of rubbing one hand against the  other.  Gradually work up to a deep circular massage.  Once deep massage is tolerated, advance to gently rubbing the sensitive hand against selected materials with increasingly rougher surfaces as indicated below.  It is important to begin with soft, smooth textures and gradually work up to rough, hard textures.  Suggested sequence of desensitization materials: 1)  Cotton balls  2)  Flannel shirt or cotton shirt  3)  Soft velvet or felt  4)  Pants  5)  Towel  6)  Upholstery or rug 7)  Sand paper   Submerge hand into containers of the following household materials and slowly move your fingers about, grasping the materials and releasing them, it is beneficial to submerge the hand quickly and then pull it back out... Work on increasing speed!  The following materials are recommended: cotton balls, popcorn, rice, macaroni, beans or sand.  Rub a brush along the hand.  Massage the hand with vibrating instruments such as an electric razor, electric toothbrush or vibrator.  Tapping exercises: finger can be tapped against the various textures listed above.  Gradually increase the speed and force used with tapping.  To improve your fine motor coordination and to encourage normal use of the hand the following exercises are useful: 1.  Nuts and bolts 2.  Clothes pins 3.  Picking up  4.  Using a typewriter  5.  Piano  Avoid 1. Hot water.  This may increase swelling and cause more pain and stiffness. 2. Painful activities.  Desensitization may be   somewhat uncomfortable, but it should not actually be painful. 3. Over-protection. Again, covering the sensitive area or avoiding use of the painful finger or hand will only increase painful sensitivity.  Therefore, excessive pain, heat and over-protection may do more harm than good.  You should not experience increased pain and swelling following your desensitization exercises.  Your therapist or surgeon will tailor the protocol to help meet your specific  needs.  Do not hesitate to ask them any questions that you may have.  USE THE HAND AS NORMALLY AS POSSIBLE Fine Motor Coordination Exercises  Perform the following exercises 2 times a day, as recommended by your occupational therapist.   Close all fingers and thumb into a tight fist and then open wide. (10 times)  Palm of hand on table, spread fingers apart, then together. (10 times)  Lift fingers and thumb off table one at a time. Increase speed as able. (10 times)  Touch thumb to each fingertip. Increase speed as able. (10 times)  Pick up 5 small objects (coins, marbles, paperclips, beads, etc.) one at a time and hold them in hand, then place them one by one onto the table.  Pick up small objects and place them into a cup or container.  Thread buttons or beads onto a string.  Place clothespins onto the edge of a cup, can, or container.  Play card games. Practice shuffling and dealing cards. Flip cards over onto table one by one.  Practice screwing and unscrewing nuts/bolts.  Stack approximately [employer]-inch objects (checkers, coins, etc.) onto table.  Use scissors to cut paper.  Practice writing skills, dot to dot, puzzles, etc.  With tweezers, pick up small objects and put into a small container. Try sorting beads or buttons.

## 2015-09-03 NOTE — Therapy (Addendum)
[place], [place], [address] Phone: [phone]   Fax:  [phone]  Occupational Therapy Treatment  Patient Details  Name: Bryan Schultz MRN: [id number] Date of Birth: 1955/09/25 Referring Provider: Dr. Alysia Penna   Encounter Date: 09/03/2015      OT End of Session - 09/03/15 1710    Visit Number 5   Number of Visits 8   Date for OT Re-Evaluation 09/11/15   Authorization Type Medicaid   Authorization Time Period 10 visits approved 3/9-09/24/2015   Authorization - Visit Number 4   Authorization - Number of Visits 10   OT Start Time 6004   OT Stop Time 1355   OT Time Calculation (min) 49 min   Activity Tolerance Patient tolerated treatment well   Behavior During Therapy [facility] for tasks assessed/performed      Past Medical History  Diagnosis Date  . Hypertension   . Myocardial infarction ([place])     No past surgical history on file.  There were no vitals filed for this visit.      Subjective Assessment - 09/03/15 1335    Subjective  S:  I cant feel anything with my right hand so it is difficult to wipe. (discussed completing as HEP when he has not had a BM, testing of light touch was [facility] at initial evaluation).     Currently in Pain? No/denies   Pain Score 0-No pain            [facility] OT Assessment - 09/03/15 0001    Assessment   Diagnosis S/P CVA with right hemiparesis, ataxia, homonymous hemianopsia   Onset Date 05/18/15   Precautions   Precautions Fall   Precaution Comments right homonymous hemianopsia with inattention to right side   Prior Function   Level of Independence Independent   Vocation On disability   Leisure enjoys welding, hunting, fishing, piddling around the house   ADL   ADL comments requires assistance with donning and doffing socks, pants, fastening buttons, washing back and buttock, toileting hygiene, has not attempted IADLs such as washing dishes.                   OT Treatments/Exercises (OP) - 09/03/15 0001    Fine Motor Coordination   Other Fine Motor Exercises completed grooved pegboard with right hand  - max difficulty due to difficulty feeling grooved peg in his hand and the ability to manipulate pegs in his right hand.  completed 9 pegs in approximate 10 minutes.  able to remove with max difficlulty by using large arm movements, recommended neutral arm with arm resting on table and patient had increased independence.     Visual/Perceptual Exercises   Other Exercises patient had difficulty locating pegs upon removal from grooved peg board.  when given HEP, pateint unable to read and comprehend the instructions.  He has always had a difficult time with reading, however he is having increased difficulty since his stroke.                  OT Education - 09/03/15 1709    Education Details [place], sensory reintegration, discussed techniques for reading - large font, simple sentences vs paragraphs, blocking out all but one line of text   Person(s) Educated Patient;Caregiver(s)   Methods Explanation;Demonstration;Handout   Comprehension Verbalized understanding;Returned demonstration          OT Short Term Goals - 08/02/15 1732    OT SHORT TERM GOAL #1  Title Patient will be educated on HEP for increased coordination, sensation, and memory needed to return to prior level of independence with B/IADLs.   Time 4   Period Weeks   Status On-going   OT SHORT TERM GOAL #2   Title Patient will improve to CGA assistance with all B/ADLs for increased safety and indepenence.   Time 4   Period Weeks   Status On-going   OT SHORT TERM GOAL #3   Title Patient will improve stereognosis to 2/3 accuracy in right hand for increased safety with household tasks.    Time 4   Period Weeks   Status On-going   OT SHORT TERM GOAL #4   Title Patient will be educated on strategies for improved memory and to compensate for visual deficits  for increased safety at home.    Time 4   Period Weeks   Status On-going   OT SHORT TERM GOAL #5   Title Patient will improve right hand fine motor coordination for improved abiity to manipulate zippers and buttons by improving completion time on nine hole peg test to 1 minute 30 seconds or less.    Time 4   Period Weeks   Status On-going           OT Long Term Goals - 08/02/15 1732    OT LONG TERM GOAL #1   Title Patient will complete all B/IADLs at SBA level or better for increased independence, using his right hand as dominant.   Time 8   Period Weeks   Status On-going   OT LONG TERM GOAL #2   Title Patient will improve stereognosis to 3/3 accuracy in his right hand for increased safety with ADLs.   Time 8   Period Weeks   Status On-going   OT LONG TERM GOAL #3   Title Patient will improve fine motor coordination needed for ability to weld by completing Nine Hole Peg test with his right hand in 50 seconds or less.   Time 8   Period Weeks   Status On-going   OT LONG TERM GOAL #4   Title Patient will utilize strategies for visual deficits and memory deficits to improve independence with BADLs independently.   Time 8   Period Weeks   Status On-going   OT LONG TERM GOAL #5   Title Patient will improve right shoulder strength to 5/5 and pinch strength to 12 pounds or better for improved abiity to hold urnial and maintain proper positioning to avoid spills.    Time 8   Period Weeks   Status On-going               Plan - 09/03/15 1710    Clinical Impression Statement A:  Patient has max difficulty with fine motor coordination activity due to sensory, coordiantion, and visual deficits.    Rehab Potential Good   OT Frequency 1x / week   OT Duration 2 weeks   OT Treatment/Interventions Self-care/ADL training;Cryotherapy;Moist Heat;Electrical Stimulation;DME and/or AE instruction;Energy conservation;Neuromuscular education;Therapeutic exercise;Manual Therapy;Passive  range of motion;Splinting;Therapeutic exercises;Therapeutic activities;Cognitive remediation/compensation;Visual/perceptual remediation/compensation;Patient/family education   Plan P:  follow up on HEP, reassessment, improve independence with ADLS (donning socks and pants) and IADLS (washing dishes).   Consulted and Agree with Plan of Care Patient      Patient will benefit from skilled therapeutic intervention in order to improve the following deficits and impairments:  Decreased cognition, Decreased coordination, Decreased mobility, Decreased safety awareness, Decreased strength, Impaired perceived functional ability, Impaired sensation,  Impaired UE functional use, Impaired vision/preception  Visit Diagnosis: Other lack of coordination  Visuospatial deficit  Other symptoms and signs involving the musculoskeletal system    Problem List Patient Active Problem List   Diagnosis Date Noted  . Right homonymous hemianopsia 07/03/2015  . Ataxia, post-stroke 07/03/2015  . Gait disturbance, post-stroke 07/03/2015  . Cognitive deficit, post-stroke 07/03/2015  . Acute ischemic left posterior cerebral artery stroke ([place]) 05/21/2015  . 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 Essential hypertension   . Morbid obesity ([place])   . HLD (hyperlipidemia)   . Tobacco abuse   . Dysmetria   . Ataxia, late effect of cerebrovascular disease   . CVA (cerebral vascular accident) ([place]) 05/19/2015  . Acute CVA (cerebrovascular accident) [facility]) 05/18/2015    Vangie Bicker, OTR/L [phone]  09/03/2015, 5:13 PM  [place] [address] [place], [place], [address] Phone: [phone]   Fax:  [phone]  Name: Deshay Blumenfeld MRN: [id number] Date of Birth: 1955/11/04  OCCUPATIONAL THERAPY DISCHARGE SUMMARY  Visits from Start of Care: 5  Current functional level related to goals / functional outcomes: All goals are  Not Met. Patient discharged due to failure to return since last visit.    Plan: Patient agrees to discharge.  Patient goals were not met. Patient is being discharged due to not returning since the last visit.  ?????

## 2015-09-06 ENCOUNTER — Encounter (HOSPITAL_COMMUNITY): Payer: Medicaid Other | Admitting: Occupational Therapy

## 2015-09-06 ENCOUNTER — Ambulatory Visit (HOSPITAL_COMMUNITY): Payer: Medicaid Other

## 2015-09-10 ENCOUNTER — Ambulatory Visit (HOSPITAL_COMMUNITY): Payer: Medicaid Other | Admitting: Specialist

## 2015-09-10 ENCOUNTER — Ambulatory Visit (HOSPITAL_COMMUNITY): Payer: Medicaid Other | Admitting: Physical Therapy

## 2015-09-13 ENCOUNTER — Encounter (HOSPITAL_COMMUNITY): Payer: Medicaid Other | Admitting: Occupational Therapy

## 2015-09-13 ENCOUNTER — Ambulatory Visit (HOSPITAL_COMMUNITY): Payer: Medicaid Other | Admitting: Physical Therapy

## 2015-09-17 ENCOUNTER — Ambulatory Visit (HOSPITAL_COMMUNITY): Payer: Medicaid Other | Admitting: Physical Therapy

## 2015-09-17 ENCOUNTER — Ambulatory Visit (HOSPITAL_COMMUNITY): Payer: Medicaid Other | Admitting: Specialist

## 2015-09-17 ENCOUNTER — Telehealth (HOSPITAL_COMMUNITY): Payer: Self-pay | Admitting: Specialist

## 2015-09-17 NOTE — Telephone Encounter (Signed)
Patient is a no show for today's visit.  He does not have any additional visits scheduled.  I attempted to call and notify of no show and schedule additional visits and could not contact the patient, as the number has changed. Bryan MylarBethany H. Murray, OTR/L [phone]

## 2015-09-20 ENCOUNTER — Encounter (HOSPITAL_COMMUNITY): Payer: Medicaid Other | Admitting: Occupational Therapy

## 2015-09-24 ENCOUNTER — Encounter (HOSPITAL_COMMUNITY): Payer: Medicaid Other | Admitting: Specialist

## 2015-09-27 ENCOUNTER — Encounter (HOSPITAL_COMMUNITY): Payer: Medicaid Other | Admitting: Occupational Therapy

## 2015-10-01 ENCOUNTER — Ambulatory Visit: Payer: Medicaid Other | Admitting: Physical Medicine & Rehabilitation

## 2015-10-19 ENCOUNTER — Ambulatory Visit: Payer: Medicaid Other | Admitting: Physical Medicine & Rehabilitation

## 2015-11-06 ENCOUNTER — Ambulatory Visit (HOSPITAL_BASED_OUTPATIENT_CLINIC_OR_DEPARTMENT_OTHER): Payer: Medicaid Other | Admitting: Physical Medicine & Rehabilitation

## 2015-11-06 ENCOUNTER — Encounter: Payer: Self-pay | Admitting: Physical Medicine & Rehabilitation

## 2015-11-06 ENCOUNTER — Encounter: Payer: Medicaid Other | Attending: Physical Medicine & Rehabilitation

## 2015-11-06 VITALS — BP 127/79 | HR 81 | Resp 16

## 2015-11-06 DIAGNOSIS — I10 Essential (primary) hypertension: Secondary | ICD-10-CM | POA: Insufficient documentation

## 2015-11-06 DIAGNOSIS — I69398 Other sequelae of cerebral infarction: Secondary | ICD-10-CM | POA: Insufficient documentation

## 2015-11-06 DIAGNOSIS — H53461 Homonymous bilateral field defects, right side: Secondary | ICD-10-CM | POA: Insufficient documentation

## 2015-11-06 DIAGNOSIS — I69393 Ataxia following cerebral infarction: Secondary | ICD-10-CM | POA: Insufficient documentation

## 2015-11-06 DIAGNOSIS — E669 Obesity, unspecified: Secondary | ICD-10-CM | POA: Diagnosis not present

## 2015-11-06 DIAGNOSIS — R269 Unspecified abnormalities of gait and mobility: Secondary | ICD-10-CM | POA: Insufficient documentation

## 2015-11-06 DIAGNOSIS — F17210 Nicotine dependence, cigarettes, uncomplicated: Secondary | ICD-10-CM | POA: Diagnosis not present

## 2015-11-06 DIAGNOSIS — I69319 Unspecified symptoms and signs involving cognitive functions following cerebral infarction: Secondary | ICD-10-CM | POA: Diagnosis not present

## 2015-11-06 DIAGNOSIS — I63532 Cerebral infarction due to unspecified occlusion or stenosis of left posterior cerebral artery: Secondary | ICD-10-CM | POA: Diagnosis present

## 2015-11-06 MED ORDER — CITALOPRAM HYDROBROMIDE 10 MG PO TABS: 10.0000 mg | ORAL_TABLET | Freq: Every day | ORAL | Status: DC

## 2015-11-06 NOTE — Progress Notes (Signed)
Subjective:    Patient ID: Bryan Schultz, male    DOB: [birth year], 58 y.o.   MRN: [id number]  HPI   Patient still complains of right-sided visual loss. He is using a straight cane and a quad cane to ambulate however his wife is trying to encourage him just to use the quad cane. He's had no falls. Denies any new medical problems. Wife is concerned that he is becoming more depressed. Last PDQ 9 was 11 Pain Inventory Average Pain 5 Pain Right Now 1 My pain is tingling and aching  In the last 24 hours, has pain interfered with the following? General activity 4 Relation with others 2 Enjoyment of life 7 What TIME of day is your pain at its worst? evening Sleep (in general) Good  Pain is worse with: walking, sitting, inactivity and standing Pain improves with: rest and medication Relief from Meds: 5  Mobility use a cane how many minutes can you walk? 5 ability to climb steps?  yes do you drive?  no transfers alone  Function disabled: date disabled NA I need assistance with the following:  dressing, bathing, toileting, meal prep, household duties and shopping  Neuro/Psych bladder control problems bowel control problems weakness numbness tingling trouble walking dizziness confusion depression anxiety  Prior Studies Any changes since last visit?  no  Physicians involved in your care Any changes since last visit?  yes Primary care .   Family History  Problem Relation Age of Onset  . Hypertension Mother   . Hypertension Father   . Hypertension Brother    Social History   Social History  . Marital Status: Divorced    Spouse Name: N/A  . Number of Children: N/A  . Years of Education: N/A   Social History Main Topics  . Smoking status: Current Every Day Smoker -- 0.50 packs/day for 40 years    Types: Cigarettes  . Smokeless tobacco: None  . Alcohol Use: No  . Drug Use: No  . Sexual Activity: Not Currently   Other Topics Concern  . None   Social  History Narrative   History reviewed. No pertinent past surgical history. Past Medical History  Diagnosis Date  . Hypertension   . Myocardial infarction ([facility])    BP 127/79 mmHg  Pulse 81  Resp 16  SpO2 95%  Opioid Risk Score:   Fall Risk Score:  `1  Depression screen PHQ 2/9  Depression screen PHQ 2/9 07/03/2015  Decreased Interest 2  Down, Depressed, Hopeless 1  PHQ - 2 Score 3  Altered sleeping 2  Tired, decreased energy 2  Change in appetite 0  Feeling bad or failure about yourself  2  Trouble concentrating 2  Moving slowly or fidgety/restless 0  Suicidal thoughts 0  PHQ-9 Score 11  Difficult doing work/chores Somewhat difficult      Review of Systems  Constitutional: Positive for unexpected weight change.       Bladder control problems Bowel control problems   Cardiovascular:       Limb swelling   Gastrointestinal: Positive for diarrhea.  Musculoskeletal: Positive for gait problem.  Neurological: Positive for dizziness, weakness and numbness.       Tingling    Psychiatric/Behavioral: Positive for confusion and dysphoric mood. The patient is nervous/anxious.   All other systems reviewed and are negative.      Objective:   Physical Exam  Constitutional: He is oriented to person, place, and time. He appears well-developed and well-nourished.  Morbid obesity  HENT:  Head: Normocephalic and atraumatic.  Eyes: Conjunctivae are normal. Pupils are equal, round, and reactive to light. Right eye exhibits no discharge. Left eye exhibits no discharge. No scleral icterus.  Visual fields show a dense right homonymous hemianopsia  Neck: Normal range of motion.  Neurological: He is alert and oriented to person, place, and time. He displays no atrophy. Coordination abnormal.  Motor strength is 4+ in the right deltoid, biceps, triceps, grip 5/5 left deltoid, biceps, triceps, grip 5/5 right hip flexor and extensor 4/5 right ankle dorsal flexor 5/5 left hip flexor and  knee extensor and ankle dorsiflexor  Sensation intact to light touch and proprioception on the right side in the upper limb  Psychiatric: His affect is blunt. His speech is delayed. He is slowed.  Nursing note and vitals reviewed.  Varus deformity bilateral knees       Assessment & Plan:  1. Left Posterior cerebral artery infarct causing right homonymous hemianopsia as well as mild residual weakness on the right side. He has a gait disorder related to his stroke.  He is approximately 6 months post stroke. We will refer to ophthalmology, Dr. [facility]ita [facility]pencer [facility]  Dr Karleen HampshireSpencer [address] [place] [id number]  Return to clinic 3 months  2. 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

## 2015-12-21 ENCOUNTER — Other Ambulatory Visit: Payer: Self-pay | Admitting: Physical Medicine & Rehabilitation

## 2016-02-05 ENCOUNTER — Ambulatory Visit: Payer: Medicaid Other | Admitting: Physical Medicine & Rehabilitation

## 2016-02-08 ENCOUNTER — Encounter: Payer: Medicaid Other | Attending: Physical Medicine & Rehabilitation

## 2016-02-08 ENCOUNTER — Encounter: Payer: Self-pay | Admitting: Physical Medicine & Rehabilitation

## 2016-02-08 ENCOUNTER — Ambulatory Visit (HOSPITAL_BASED_OUTPATIENT_CLINIC_OR_DEPARTMENT_OTHER): Payer: Medicaid Other | Admitting: Physical Medicine & Rehabilitation

## 2016-02-08 VITALS — BP 123/82 | HR 98

## 2016-02-08 DIAGNOSIS — E669 Obesity, unspecified: Secondary | ICD-10-CM | POA: Insufficient documentation

## 2016-02-08 DIAGNOSIS — I69393 Ataxia following cerebral infarction: Secondary | ICD-10-CM

## 2016-02-08 DIAGNOSIS — R269 Unspecified abnormalities of gait and mobility: Secondary | ICD-10-CM

## 2016-02-08 DIAGNOSIS — I10 Essential (primary) hypertension: Secondary | ICD-10-CM | POA: Insufficient documentation

## 2016-02-08 DIAGNOSIS — I63532 Cerebral infarction due to unspecified occlusion or stenosis of left posterior cerebral artery: Secondary | ICD-10-CM | POA: Diagnosis present

## 2016-02-08 DIAGNOSIS — H53461 Homonymous bilateral field defects, right side: Secondary | ICD-10-CM | POA: Diagnosis not present

## 2016-02-08 DIAGNOSIS — I69398 Other sequelae of cerebral infarction: Secondary | ICD-10-CM | POA: Diagnosis not present

## 2016-02-08 DIAGNOSIS — F17210 Nicotine dependence, cigarettes, uncomplicated: Secondary | ICD-10-CM | POA: Insufficient documentation

## 2016-02-08 DIAGNOSIS — I69319 Unspecified symptoms and signs involving cognitive functions following cerebral infarction: Secondary | ICD-10-CM

## 2016-02-08 DIAGNOSIS — G890 Central pain syndrome: Secondary | ICD-10-CM

## 2016-02-08 MED ORDER — GABAPENTIN 100 MG PO CAPS: 100.0000 mg | ORAL_CAPSULE | Freq: Three times a day (TID) | ORAL | 1 refills | Status: DC

## 2016-02-08 NOTE — Progress Notes (Signed)
Subjective:    Patient ID: Bryan Schultz, male    DOB: 21-Dec-1955, 60 y.o.   MRN: [id number]  HPI 60 year old male with history of left PCA infarct, onset in December 2016.He has a history of morbid obesity as well. He has completed both inpatient and outpatient rehabilitation.He is independent with his ADLs but needs some supervision for ambulation. New or problem is increasing pain on the right side of his body, lower limb greater than upper limb.No falls or trauma.PCP evaluated this issue and ordered MRI no increased weakness on the right side. Pain Inventory Average Pain 7 Pain Right Now 8 My pain is intermittent and varies  In the last 24 hours, has pain interfered with the following? General activity 6 Relation with others 5 Enjoyment of life 7 What TIME of day is your pain at its worst? daytime Sleep (in general) Fair  Pain is worse with: walking, bending, sitting, inactivity and standing Pain improves with: rest and medication Relief from Meds: 3  Mobility walk with assistance use a cane ability to climb steps?  no do you drive?  no use a wheelchair needs help with transfers Do you have any goals in this area?  no  Function disabled: date disabled .  Neuro/Psych bladder control problems bowel control problems weakness numbness tingling trouble walking depression anxiety  Prior Studies Any changes since last visit?  no  Physicians involved in your care Any changes since last visit?  no   Family History  Problem Relation Age of Onset  . Hypertension Mother   . Hypertension Father   . Hypertension Brother    Social History   Social History  . Marital status: Divorced    Spouse name: N/A  . Number of children: N/A  . Years of education: N/A   Social History Main Topics  . Smoking status: Current Every Day Smoker    Packs/day: 0.50    Years: 40.00    Types: Cigarettes  . Smokeless tobacco: Never Used  . Alcohol use No  . Drug use: No  .  Sexual activity: Not Currently   Other Topics Concern  . Not on file   Social History Narrative  . No narrative on file   No past surgical history on file. Past Medical History:  Diagnosis Date  . Hypertension   . Myocardial infarction ([facility])    BP 123/82 (BP Location: Left Arm, Patient Position: Sitting, Cuff Size: Large)   Pulse 98   SpO2 94%   Opioid Risk Score:   Fall Risk Score:  `1  Depression screen PHQ 2/9  Depression screen PHQ 2/9 07/03/2015  Decreased Interest 2  Down, Depressed, Hopeless 1  PHQ - 2 Score 3  Altered sleeping 2  Tired, decreased energy 2  Change in appetite 0  Feeling bad or failure about yourself  2  Trouble concentrating 2  Moving slowly or fidgety/restless 0  Suicidal thoughts 0  PHQ-9 Score 11  Difficult doing work/chores Somewhat difficult    Review of Systems  HENT: Negative.   Eyes: Negative.   Respiratory: Positive for wheezing.   Cardiovascular: Positive for leg swelling.  Gastrointestinal: Positive for diarrhea.  Endocrine: Negative.   Genitourinary: Positive for difficulty urinating.  Musculoskeletal: Positive for arthralgias and myalgias.  Skin: Negative.   Allergic/Immunologic: Negative.   Neurological: Positive for weakness and numbness.       Tingling   Psychiatric/Behavioral: Positive for dysphoric mood. The patient is nervous/anxious.   All other systems reviewed and are  negative.      Objective:   Physical Exam  Constitutional: He is oriented to person, place, and time. He appears well-developed.  morbid obesity  HENT:  Head: Normocephalic and atraumatic.  Eyes: Conjunctivae and EOM are normal. Pupils are equal, round, and reactive to light.  Neurological: He is alert and oriented to person, place, and time. He has normal strength. A sensory deficit is present. He exhibits normal muscle tone. Gait abnormal.  Diminished sensation with hypersensitivity in the right hand and the right lower extrem  Reduced  balance, increased base of support  Right field cut  Psychiatric: He has a normal mood and affect.  Nursing note and vitals reviewed.         Assessment & Plan:  1.  Left PCA infarct with residual gait disorder and R homonymous hemianopsia Completed IP and OP rehab, needs enc for HEP  2.  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

## 2016-02-08 NOTE — Patient Instructions (Signed)
Please exercise as much as possible

## 2016-04-01 ENCOUNTER — Other Ambulatory Visit: Payer: Self-pay | Admitting: Physical Medicine & Rehabilitation

## 2016-04-08 ENCOUNTER — Ambulatory Visit (HOSPITAL_BASED_OUTPATIENT_CLINIC_OR_DEPARTMENT_OTHER): Payer: Medicaid Other | Admitting: Physical Medicine & Rehabilitation

## 2016-04-08 ENCOUNTER — Encounter: Payer: Medicaid Other | Attending: Physical Medicine & Rehabilitation

## 2016-04-08 ENCOUNTER — Encounter: Payer: Self-pay | Admitting: Physical Medicine & Rehabilitation

## 2016-04-08 VITALS — BP 129/74 | HR 76 | Resp 16

## 2016-04-08 DIAGNOSIS — I69319 Unspecified symptoms and signs involving cognitive functions following cerebral infarction: Secondary | ICD-10-CM | POA: Insufficient documentation

## 2016-04-08 DIAGNOSIS — I69393 Ataxia following cerebral infarction: Secondary | ICD-10-CM

## 2016-04-08 DIAGNOSIS — I10 Essential (primary) hypertension: Secondary | ICD-10-CM | POA: Diagnosis not present

## 2016-04-08 DIAGNOSIS — E669 Obesity, unspecified: Secondary | ICD-10-CM | POA: Diagnosis not present

## 2016-04-08 DIAGNOSIS — I63532 Cerebral infarction due to unspecified occlusion or stenosis of left posterior cerebral artery: Secondary | ICD-10-CM | POA: Diagnosis present

## 2016-04-08 DIAGNOSIS — R269 Unspecified abnormalities of gait and mobility: Secondary | ICD-10-CM

## 2016-04-08 DIAGNOSIS — H53461 Homonymous bilateral field defects, right side: Secondary | ICD-10-CM | POA: Diagnosis not present

## 2016-04-08 DIAGNOSIS — F17210 Nicotine dependence, cigarettes, uncomplicated: Secondary | ICD-10-CM | POA: Diagnosis not present

## 2016-04-08 DIAGNOSIS — I69398 Other sequelae of cerebral infarction: Secondary | ICD-10-CM | POA: Diagnosis not present

## 2016-04-08 NOTE — Progress Notes (Signed)
Subjective:    Patient ID: Bryan Schultz, male    DOB: 06/10/1955, 60 y.o.   MRN: [id number]  HPI Patient fell approximately 2 months ago going into his home. He hurt his knees. He has not really walked much since that time. He has had an MRI of his lumbar spine ordered by his primary care doctor and is now being referred to a neurosurgeon. Dr. Channing Muttersoy He should has not been sleeping well since this time. Has not been walking much at all Appetite a little off, has lost about 30 pounds  Patient can self propel in a manual wheelchair. The current one. He has has not been fitted to him and it is too narrow.  Pain Inventory Average Pain 7 Pain Right Now 5 My pain is intermittent, sharp, burning, tingling and aching  In the last 24 hours, has pain interfered with the following? General activity 7 Relation with others 7 Enjoyment of life 7 What TIME of day is your pain at its worst? morning Sleep (in general) Good  Pain is worse with: some activites Pain improves with: . Relief from Meds: 6  Mobility use a walker do you drive?  no use a wheelchair needs help with transfers  Function disabled: date disabled . I need assistance with the following:  dressing, bathing, toileting, meal prep, household duties and shopping  Neuro/Psych bladder control problems bowel control problems weakness numbness tingling trouble walking confusion depression anxiety  Prior Studies Any changes since last visit?  no  Physicians involved in your care Any changes since last visit?  no   Family History  Problem Relation Age of Onset  . Hypertension Mother   . Hypertension Father   . Hypertension Brother    Social History   Social History  . Marital status: Divorced    Spouse name: N/A  . Number of children: N/A  . Years of education: N/A   Social History Main Topics  . Smoking status: Current Every Day Smoker    Packs/day: 0.50    Years: 40.00    Types: Cigarettes  . Smokeless  tobacco: Never Used  . Alcohol use No  . Drug use: No  . Sexual activity: Not Currently   Other Topics Concern  . None   Social History Narrative  . None   No past surgical history on file. Past Medical History:  Diagnosis Date  . Hypertension   . Myocardial infarction    BP 129/74   Pulse 76   Resp 16   SpO2 93%   Opioid Risk Score:   Fall Risk Score:  `1  Depression screen PHQ 2/9  Depression screen PHQ 2/9 07/03/2015  Decreased Interest 2  Down, Depressed, Hopeless 1  PHQ - 2 Score 3  Altered sleeping 2  Tired, decreased energy 2  Change in appetite 0  Feeling bad or failure about yourself  2  Trouble concentrating 2  Moving slowly or fidgety/restless 0  Suicidal thoughts 0  PHQ-9 Score 11  Difficult doing work/chores Somewhat difficult   Review of Systems  Constitutional: Negative.   HENT: Negative.   Eyes: Negative.   Respiratory: Positive for wheezing.   Cardiovascular: Positive for leg swelling.  Gastrointestinal: Negative.   Endocrine: Negative.   Genitourinary: Negative.   Musculoskeletal: Negative.   Skin: Positive for rash.  Allergic/Immunologic: Negative.   Neurological: Negative.   Hematological: Bruises/bleeds easily.  Psychiatric/Behavioral: Negative.   All other systems reviewed and are negative.      Objective:  Physical Exam  Constitutional: He is oriented to person, place, and time. He appears well-developed and well-nourished.  HENT:  Head: Normocephalic and atraumatic.  Eyes: Conjunctivae and EOM are normal. Pupils are equal, round, and reactive to light.  Neck: Normal range of motion.  Neurological: He is alert and oriented to person, place, and time.  Motor strength is 5 minus/5 in the right deltoid, biceps, triceps, grip, hip flexor, knee extensor, ankle dorsi flexion 5/5 and left hip flexor, knee extensors, ankle dorsi flexion, deltoid, bicep, tricep Standing balance is poor. His wide-based support. Unable to take steps  without walker and assistance. He is able to propel wheelchair. Mild limitation in shoulder range of motion, but is able to get hands behind head and behind back.  Psychiatric: Thought content normal. His affect is blunt. His speech is delayed. He is slowed. He expresses inappropriate judgment. He exhibits abnormal recent memory.  Nursing note and vitals reviewed. Ataxia, right upper greater than right lower limb         Assessment & Plan:  1. Left PCA distribution infarct, residual cognitive deficits, gait disorder, ataxia. He is plateauing in terms of level of functioning. However, he has declined in functioning related to fall, loss of attendance as well as back injury. Going to neurosurgery for further evaluation. Have ordered home health PT and OT to come out once again He would benefit from a fitted manual wheelchair Would be concerned about his cognition and visual issues in terms of using a power chair

## 2016-04-08 NOTE — Patient Instructions (Signed)
Orders place for home health

## 2016-04-25 ENCOUNTER — Other Ambulatory Visit: Payer: Self-pay | Admitting: Physical Medicine & Rehabilitation

## 2016-05-16 ENCOUNTER — Ambulatory Visit: Payer: Medicaid Other | Admitting: Physical Medicine & Rehabilitation

## 2016-05-16 ENCOUNTER — Encounter: Payer: Medicaid Other | Attending: Physical Medicine & Rehabilitation

## 2016-05-16 DIAGNOSIS — I69393 Ataxia following cerebral infarction: Secondary | ICD-10-CM | POA: Insufficient documentation

## 2016-05-16 DIAGNOSIS — F17210 Nicotine dependence, cigarettes, uncomplicated: Secondary | ICD-10-CM | POA: Insufficient documentation

## 2016-05-16 DIAGNOSIS — I10 Essential (primary) hypertension: Secondary | ICD-10-CM | POA: Insufficient documentation

## 2016-05-16 DIAGNOSIS — H53461 Homonymous bilateral field defects, right side: Secondary | ICD-10-CM | POA: Insufficient documentation

## 2016-05-16 DIAGNOSIS — R269 Unspecified abnormalities of gait and mobility: Secondary | ICD-10-CM | POA: Insufficient documentation

## 2016-05-16 DIAGNOSIS — E669 Obesity, unspecified: Secondary | ICD-10-CM | POA: Insufficient documentation

## 2016-05-16 DIAGNOSIS — I69319 Unspecified symptoms and signs involving cognitive functions following cerebral infarction: Secondary | ICD-10-CM | POA: Insufficient documentation

## 2016-05-16 DIAGNOSIS — I69398 Other sequelae of cerebral infarction: Secondary | ICD-10-CM | POA: Insufficient documentation

## 2016-05-23 ENCOUNTER — Other Ambulatory Visit: Payer: Self-pay | Admitting: Physical Medicine & Rehabilitation

## 2016-05-23 ENCOUNTER — Other Ambulatory Visit: Payer: Self-pay

## 2016-05-28 ENCOUNTER — Ambulatory Visit (HOSPITAL_COMMUNITY): Payer: Medicaid Other | Attending: Internal Medicine | Admitting: Occupational Therapy

## 2016-05-28 ENCOUNTER — Encounter (HOSPITAL_COMMUNITY): Payer: Self-pay | Admitting: Occupational Therapy

## 2016-05-28 DIAGNOSIS — M6281 Muscle weakness (generalized): Secondary | ICD-10-CM | POA: Insufficient documentation

## 2016-05-28 DIAGNOSIS — R278 Other lack of coordination: Secondary | ICD-10-CM | POA: Insufficient documentation

## 2016-05-28 NOTE — Therapy (Addendum)
[facility] [address] [place], [place], [address] Phone: [phone]   Fax:  [phone]  Occupational Therapy Wheelchair Evaluation  Patient Details  Name: Bryan Schultz MRN: [id number] Date of Birth: 07/12/1955 Referring Provider: Dr. Lia HoppingXaje Hasanaj  Encounter Date: 05/28/2016      OT End of Session - 05/28/16 1524    Visit Number 1   Number of Visits 1   Date for OT Re-Evaluation 05/31/15   Authorization Type Medicaid   OT Start Time 1345   OT Stop Time 1420   OT Time Calculation (min) 35 min   Activity Tolerance Patient tolerated treatment well   Behavior [facility] Therapy [facility] for tasks assessed/performed      Past Medical History:  Diagnosis Date  . Hypertension   . Myocardial infarction     No past surgical history on file.  There were no vitals filed for this visit.      Subjective Assessment - 05/28/16 1521    Currently in Pain? No/denies           [facility] - 05/28/16 1521      Assessment   Diagnosis wheelchair evaluation   Referring Provider Dr. Lia HoppingXaje Hasanaj     Precautions   Precautions Fall     Balance Screen   Has the patient fallen in the past 6 months Yes   How many times? 1   Has the patient had a decrease in activity level because of a fear of falling?  Yes   Is the patient reluctant to leave their home because of a fear of falling?  Yes       Date: 05/28/2016 Patient Name: Bryan Schultz Address: [address], [place], [place] [address] DOB: 09/19/1955  To whom it may concern,   Bryan Schultz is a 61 year old male who has been referred to occupational therapy for assessment for need of a power wheelchair. 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 Bryan Schultz experienced a fall in September 2017 during which he injured his knees, and has subsequently limited him to standing and  taking minimal steps for transfers only due to pain and weakness.   Bryan Schultz currently lives with his significant other/roommate, April. He lives in a mobile home and has a ramped entrance. He has a CNA 3.5 hours per day, 5 days per week who assists with ADL tasks. April assists with ADL completion when aide is not available. He does requires moderate assistance for dressing, bathing, toileting, meal preparation, and medication management. Occasionally Bryan Schultz has good days during which he is able to complete tasks with less assistance. Bryan Schultz is also a heavy assist for transfer and mobility tasks, at times requiring two people to assist.   Bryan Schultz presents for evaluation using a manual wheelchair, requiring assistance to travel from waiting room to exam room due to inability to propel manual wheelchair independently. Bryan Schultz was able to hold feet off ground for aide to push wheelchair. Bryan Schultz's goal for obtaining a power wheelchair is to increase his functional independence in his home and to reduce the burden for his caregivers. He would like to move about his home and freely.  Currently he has been provided with a power wheelchair on loan from [facility]. He has been using this wheelchair in his home for approximately 1-2 months and has significantly  increased his independence in mobility in the home. His caregiver reports this has lessened the burden of pushing him around the house and up and down the ramp.   During evaluation, Bryan Schultz propelled manual wheelchair approximately 10 feet, limited by RUE weakness and sensation deficits limiting ability to grip and operate manual wheelchair.   A FULL PHYSICAL ASSESSMENT REVEALS THE FOLLOWING   Existing Equipment: manual wheelchair borrowed from neighbor, power wheelchair on   loan from [facility], BSC, walker, quad cane, single point cane, shower seat,   ramp, hospital bed borrowed from family member. Currently the manual  wheelchair Bryan Schultz.   Schultz uses is too narrow for his body, however a larger wheelchair will not fit down the   hallway and through the door at his home. The manual wheelchair on loan fits through all  doors and hallways, and is a proper fit for Bryan Schultz build.    Transfers: Mod assist for sit<>stand, mod assist to maintain standing safely with pt standing at table and wheelchair positioned behind pt; caregiver reports this is a "really good day." OT observed pt transfer from manual wheelchair to car: caregiver positioned wheelchair at car door, min guard/min assist to stand with pt holding onto car door to pull up. Pt performed stand-pivot transfer with increased time and min guard, using doorframe to support and steady weight. Significantly increased time required for transfer and bringing legs into car.    Head and Neck: WFL     Trunk: WFL  Pelvis: WFL     Hip: Bilateral A/ROM is WFL   Knees: Bilateral A/ROM is WFL   Feet and Ankles: Bilateral A/ROM is WFL    Upper Extremities: Bilateral A/ROM is [facility]; Strength: Right shoulder/elbow/wrist 4/5;   Left shoulder/elbow/wrist 5/5. Right grip strength is significantly weaker than left.   Lower Extremitites: Right hip/knee strength 4-/5; Left hip/knee strength 5/5; Right   ankle strength 3/5, left 5/5   Weight Shifting Ability: Bryan Schultz is independent in weight shifting    Skin Integrity: No history of issues with pressure sores on this date.     Cognition: Bryan Schultz has experienced cognitive deficits as a result of his 2016 CVA.   However during evaluation Bryan Schultz is able to answer questions and perform simple   problem solving scenarios. Caregiver reports he has been using the loaned power   wheelchair in the home with no safety issues; supervision is provided 24/7.    Activity Tolerance: Fair to poor: Bryan Schultz and caregiver report that with the power wheelchair he is able to sit up for 3-4 hours at a time, whereas he is unable to  remain in manual wheelchair for greater than 1-2 hours due to ill fit and discomfort. When standing, Bryan Schultz is unable to maintain standing for greater than approximately 3-4 minutes before requiring a rest break.   GOALS/OBJECTIVE OF SEATING INTERVENTION:    Function: Bryan Schultz. Roughton has functional deficits in the areas of self-care and mobility as a  result of his past CVA. Specific functional limitations include limited ability to perform transfers and to walk at baseline, inability to propel a manual wheelchair for distances of greater than approximately 10 feet in the home due to RUE strength and sensation deficits and poor activity tolerance. Bryan Schultz. Leinbach is motivated to use a power wheelchair on a daily basis as his primary means of functional mobility in the home, and is cognitively able to operate said power wheelchair with close supervision. I  recommend a standard power wheelchair which will meet current and future positioning and mobility needs, increasing his safety and independence with daily activities, community mobility, and overall quality of life, as well as reducing caregiver burden.   Listed below are the power wheelchair recommendations:   [facility] Manufacturer Mfg's Item # Description   [phone] Quantum [id number] Q6 EDGE HD 3MPHD-SS  [id number] Quantum [id number] Group 24 Battery (x2)  [id number] Quantum [id number] Swing-away Joystick  E1007 Quantum [id number] Heavy Duty Tru-Balance Tilt & Recline  E2620 Quantum [id number] TruComfort 2 Backrest  [phone] Quantum [id number] 100 AMP Q-Logic EX Controller  E2311 Quantum [id number] Multipower Option Electronic Connection  [phone] Quantum [id number] Harness for Expandable Controller  [phone] Quantum [id number] Power Center Mt ART Foot Platform   [phone] Quantum [id number] Medium Pelvic/Thigh Guide Pad (x2)  [phone] Quantum [id number] Pelvic/Thigh Guide Removable Hardware (X2)  [id number] Quantum [id number] Comfort Plus Headrest Pad w/ Hardware  [phone] Quantum  [id number] TruComfort 2 Skin Protection Cushion   [phone]: Quantum Q6 Edge Heavy Duty Power Wheelchair  Patient has physical and cognitive capabilities to use the recommended power mobility device. Patient's home provides adequate access between rooms, maneuvering space, and surfaces for the operation of a power chair. This power base is required in order to provide the patient with the medically necessary electronics and multiple power operations through the recommended hand control. This chair is more maneuverable than comparably priced traditional power wheelchairs due to the drive wheels being directly at the center of gravity of the user. This maneuverability is required to enable the patient to negotiate the tight turns, small spaces, and obstacles characteristic of the home environment.   E2311/E2377/E2313: Multiple Actuator Control Through Expandable Controller  These electronics are sufficiently programmable to allow patient to operate the power chair safely within the home environment. The patient requires these controls to operate the power seat functions through the joystick, thus eliminating additional switches and the need to find additional access points, as they are proficient with controlling the power chair through the joystick. These controls will also allow for any further modifications/addition to the drive controls if the patient's condition should worsen.   V8938: Heavy Duty Power Tilt and Recline  Patient must rest in the recumbent position several times per day and transfers between the wheelchair and bed are very difficult. The tilt and recline mechanism decreases the need for extensive assistance required for multiple transfers and allows patient to independently position self in the chair. The patient has history of sacral pressure sore, and requires need to independently pressure relieve. The recline feature allows for full caregiver access, assisting in clothing/brief changes.    B0175: Power Elevating Centermount Footplate  The power footplate will allow for proper positioning while seated in the power wheelchair. Elevating the feet while in the reclined position relieves tension/strain on the hamstrings.   Z0258: Headrest Pad w/ Multi Axis Hardware  Patient requires the headrest to provide posterior support for head and neck while in a tilted position. Without a headrest, patient would experience strain in the neck muscles, reducing patient's capacity to tilt to relieve pressure and spasms.   N2778: Swing Away Joystick Mount  A swing away joystick mount allows patient to come closer to tables for meals and other activities. It also allows patient clear access to armrests when transferring.   E2603: TruComfort Skin Protection Cushion  The patient spends most of the day in the seated position, and is at high risk of skin breakdown. This skin protection cushion  will neutralize pelvis in midline, as well as aide the prevention of skin breakdown.   Z6109: TruComfort Back  The patient has diminished balance and torso strength. This positioning back rest will provide lateral and posterior trunk support, keeping patient in upright seated position.   U0454: Pelvic/Thigh Guide Pads  The patient sits with legs widely abducted. These thigh guide pads will keep patient's legs in midline, promoting proper seating posture and preventing injury.   U9811: Pelvic/Thigh Guide Hardware  Removable hardware for the thigh guides allows patient to remove for transfers.   B1478: Group 24 Batteries  A pair of Group 24 batteries are required to power the wheelchair, as well as power seat functions.    If you require any further information concerning Bryan Schultz. Merland Holness positioning, independence or mobility needs; or any further information why a lesser device will not work, please do not hesitate to contact me at [facility], [address]  [place], [place] [address] [phone].   Ezra Sites, OTR/L [phone] 05/28/2016        Plan - 05/28/16 1532    Rehab Potential Excellent   OT Frequency One time visit      Pt will benefit from skilled therapeutic intervention in order to improve upon the following deficits: difficulty walking, decreased strength, decreased mobility  Visit Diagnosis: Muscle weakness (generalized)  Other lack of coordination    Problem List Patient Active Problem List   Diagnosis Date Noted  . Right homonymous hemianopsia 07/03/2015  . Ataxia, post-stroke 07/03/2015  . Gait disturbance, post-stroke 07/03/2015  . Cognitive deficit, post-stroke 07/03/2015  . 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 Essential hypertension   . Morbid obesity (HCC)   . HLD (hyperlipidemia)   . Tobacco abuse   . Dysmetria   . Ataxia, late effect of cerebrovascular disease   . CVA (cerebral vascular accident) (HCC) 05/19/2015  . Acute CVA (cerebrovascular accident) [facility]) 05/18/2015   Ezra Sites, OTR/L  [phone] 05/28/2016, 3:37 PM  [facility] [address] [place], [place], [address] Phone: [phone]   Fax:  [phone]  Name: Sylvestre Rathgeber MRN: [id number] Date of Birth: 1956/05/20

## 2016-06-02 ENCOUNTER — Telehealth (HOSPITAL_COMMUNITY): Payer: Self-pay | Admitting: Occupational Therapy

## 2016-06-02 NOTE — Telephone Encounter (Signed)
Josh called to talk to Gem about wheelchair eval. Merrily Pew will email Magda Paganini and plan to met pt at his home

## 2016-07-21 ENCOUNTER — Other Ambulatory Visit: Payer: Self-pay | Admitting: Physical Medicine & Rehabilitation
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
## 2018-04-09 ENCOUNTER — Encounter (HOSPITAL_COMMUNITY): Payer: Self-pay | Admitting: Physical Therapy

## 2018-04-09 NOTE — Therapy (Signed)
[place], [place], [address] Phone: [phone]   Fax:  [phone]  Patient Details  Name: Bryan Schultz MRN: [id number] Date of Birth: 08/01/1955 Referring Provider:  No ref. provider found  Encounter Date: 04/09/2018   PHYSICAL THERAPY DISCHARGE SUMMARY  Visits from Start of Care: 1  Current functional level related to goals / functional outcomes: none   Remaining deficits: all   Education / Equipment: HEP Plan: Patient agrees to discharge.  Patient goals were not met. Patient is being discharged due to not returning since the last visit.  ?????     Rayetta Humphrey, PT CLT [phone] 04/09/2018, 8:22 AM  [place], [place], [address] Phone: [phone]   Fax:  [phone]

## 2018-05-04 ENCOUNTER — Encounter (HOSPITAL_COMMUNITY): Payer: Self-pay
